# Patient Record
Sex: Female | Born: 1975
Health system: Southern US, Community
[De-identification: ages and names within clinical notes are randomized; demographics above are authoritative.]

## PROBLEM LIST (undated history)

## (undated) DIAGNOSIS — R87610 Atypical squamous cells of undetermined significance on cytologic smear of cervix (ASC-US): Secondary | ICD-10-CM

## (undated) DIAGNOSIS — L719 Rosacea, unspecified: Secondary | ICD-10-CM

## (undated) HISTORY — DX: Rosacea, unspecified: L71.9

---
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
## 1998-12-20 ENCOUNTER — Inpatient Hospital Stay (HOSPITAL_COMMUNITY): Admission: AD | Admit: 1998-12-20 | Discharge: 1998-12-22 | Payer: Self-pay | Admitting: Gynecology

## 1999-02-03 ENCOUNTER — Other Ambulatory Visit: Admission: RE | Admit: 1999-02-03 | Discharge: 1999-02-03 | Payer: Self-pay | Admitting: Gynecology

## 1999-09-15 ENCOUNTER — Other Ambulatory Visit: Admission: RE | Admit: 1999-09-15 | Discharge: 1999-09-15 | Payer: Self-pay | Admitting: Gynecology

## 2000-04-05 ENCOUNTER — Inpatient Hospital Stay (HOSPITAL_COMMUNITY): Admission: AD | Admit: 2000-04-05 | Discharge: 2000-04-06 | Payer: Self-pay | Admitting: Gynecology

## 2001-11-22 ENCOUNTER — Other Ambulatory Visit: Admission: RE | Admit: 2001-11-22 | Discharge: 2001-11-22 | Payer: Self-pay | Admitting: Gynecology

## 2003-07-02 ENCOUNTER — Other Ambulatory Visit: Admission: RE | Admit: 2003-07-02 | Discharge: 2003-07-02 | Payer: Self-pay | Admitting: Gynecology

## 2004-03-07 ENCOUNTER — Inpatient Hospital Stay (HOSPITAL_COMMUNITY): Admission: AD | Admit: 2004-03-07 | Discharge: 2004-03-08 | Payer: Self-pay | Admitting: Gynecology

## 2004-06-30 ENCOUNTER — Inpatient Hospital Stay (HOSPITAL_COMMUNITY): Admission: AD | Admit: 2004-06-30 | Discharge: 2004-07-02 | Payer: Self-pay | Admitting: Obstetrics and Gynecology

## 2004-08-11 ENCOUNTER — Other Ambulatory Visit: Admission: RE | Admit: 2004-08-11 | Discharge: 2004-08-11 | Payer: Self-pay | Admitting: Gynecology

## 2005-03-21 ENCOUNTER — Encounter: Admission: RE | Admit: 2005-03-21 | Discharge: 2005-03-21 | Payer: Self-pay | Admitting: Family Medicine

## 2005-03-21 ENCOUNTER — Encounter: Payer: Self-pay | Admitting: Internal Medicine

## 2005-03-30 ENCOUNTER — Encounter: Payer: Self-pay | Admitting: Internal Medicine

## 2005-03-30 ENCOUNTER — Encounter: Admission: RE | Admit: 2005-03-30 | Discharge: 2005-03-30 | Payer: Self-pay | Admitting: Family Medicine

## 2005-05-01 ENCOUNTER — Encounter: Admission: RE | Admit: 2005-05-01 | Discharge: 2005-05-01 | Payer: Self-pay | Admitting: Otolaryngology

## 2005-09-21 ENCOUNTER — Other Ambulatory Visit: Admission: RE | Admit: 2005-09-21 | Discharge: 2005-09-21 | Payer: Self-pay | Admitting: Gynecology

## 2006-06-14 ENCOUNTER — Encounter: Admission: RE | Admit: 2006-06-14 | Discharge: 2006-06-14 | Payer: Self-pay | Admitting: Otolaryngology

## 2006-11-15 ENCOUNTER — Other Ambulatory Visit: Admission: RE | Admit: 2006-11-15 | Discharge: 2006-11-15 | Payer: Self-pay | Admitting: Gynecology

## 2007-11-17 ENCOUNTER — Other Ambulatory Visit: Admission: RE | Admit: 2007-11-17 | Discharge: 2007-11-17 | Payer: Self-pay | Admitting: Gynecology

## 2008-07-23 LAB — CONVERTED CEMR LAB: Pap Smear: NORMAL

## 2008-09-05 ENCOUNTER — Encounter: Payer: Self-pay | Admitting: Internal Medicine

## 2009-02-12 ENCOUNTER — Ambulatory Visit: Payer: Self-pay | Admitting: Gynecology

## 2009-02-12 ENCOUNTER — Other Ambulatory Visit: Admission: RE | Admit: 2009-02-12 | Discharge: 2009-02-12 | Payer: Self-pay | Admitting: Gynecology

## 2009-02-12 ENCOUNTER — Encounter: Payer: Self-pay | Admitting: Gynecology

## 2009-05-28 ENCOUNTER — Ambulatory Visit: Payer: Self-pay | Admitting: Internal Medicine

## 2009-05-28 LAB — CONVERTED CEMR LAB
ALT: 12 units/L (ref 0–35)
AST: 15 units/L (ref 0–37)
Albumin: 3.9 g/dL (ref 3.5–5.2)
Alkaline Phosphatase: 31 units/L — ABNORMAL LOW (ref 39–117)
BUN: 11 mg/dL (ref 6–23)
Basophils Absolute: 0 10*3/uL (ref 0.0–0.1)
Basophils Relative: 0.7 % (ref 0.0–3.0)
Bilirubin Urine: NEGATIVE
Bilirubin, Direct: 0.1 mg/dL (ref 0.0–0.3)
CO2: 28 meq/L (ref 19–32)
Calcium: 9.2 mg/dL (ref 8.4–10.5)
Chloride: 105 meq/L (ref 96–112)
Cholesterol: 143 mg/dL (ref 0–200)
Creatinine, Ser: 0.7 mg/dL (ref 0.4–1.2)
Eosinophils Absolute: 0 10*3/uL (ref 0.0–0.7)
Eosinophils Relative: 0.8 % (ref 0.0–5.0)
GFR calc non Af Amer: 101.5 mL/min (ref >60)
Glucose, Bld: 78 mg/dL (ref 70–99)
HCT: 40.6 % (ref 36.0–46.0)
HDL: 48.1 mg/dL (ref >39.00)
Hemoglobin, Urine: NEGATIVE
Hemoglobin: 13.3 g/dL (ref 12.0–15.0)
LDL Cholesterol: 85 mg/dL (ref 0–99)
Leukocytes, UA: NEGATIVE
Lymphocytes Relative: 38.2 % (ref 12.0–46.0)
Lymphs Abs: 2 10*3/uL (ref 0.7–4.0)
MCHC: 32.8 g/dL (ref 30.0–36.0)
MCV: 95.6 fL (ref 78.0–100.0)
Monocytes Absolute: 0.5 10*3/uL (ref 0.1–1.0)
Monocytes Relative: 9.2 % (ref 3.0–12.0)
Neutro Abs: 2.7 10*3/uL (ref 1.4–7.7)
Neutrophils Relative %: 51.1 % (ref 43.0–77.0)
Nitrite: NEGATIVE
Platelets: 196 10*3/uL (ref 150.0–400.0)
Potassium: 4.1 meq/L (ref 3.5–5.1)
RBC: 4.25 M/uL (ref 3.87–5.11)
RDW: 11.8 % (ref 11.5–14.6)
Sodium: 139 meq/L (ref 135–145)
Specific Gravity, Urine: 1.025 (ref 1.000–1.030)
TSH: 1.98 microintl units/mL (ref 0.35–5.50)
Total Bilirubin: 0.9 mg/dL (ref 0.3–1.2)
Total CHOL/HDL Ratio: 3
Total Protein, Urine: NEGATIVE mg/dL
Total Protein: 6.9 g/dL (ref 6.0–8.3)
Triglycerides: 49 mg/dL (ref 0.0–149.0)
Urine Glucose: NEGATIVE mg/dL
Urobilinogen, UA: 0.2 (ref 0.0–1.0)
VLDL: 9.8 mg/dL (ref 0.0–40.0)
WBC: 5.2 10*3/uL (ref 4.5–10.5)
pH: 5.5 (ref 5.0–8.0)

## 2009-05-30 ENCOUNTER — Ambulatory Visit: Payer: Self-pay | Admitting: Internal Medicine

## 2009-05-30 DIAGNOSIS — F411 Generalized anxiety disorder: Secondary | ICD-10-CM | POA: Insufficient documentation

## 2009-05-30 DIAGNOSIS — G43009 Migraine without aura, not intractable, without status migrainosus: Secondary | ICD-10-CM | POA: Insufficient documentation

## 2009-05-31 ENCOUNTER — Telehealth: Payer: Self-pay | Admitting: Internal Medicine

## 2009-06-07 ENCOUNTER — Encounter: Payer: Self-pay | Admitting: Internal Medicine

## 2009-06-18 ENCOUNTER — Encounter: Payer: Self-pay | Admitting: Internal Medicine

## 2009-09-04 ENCOUNTER — Ambulatory Visit: Payer: Self-pay | Admitting: Gynecology

## 2009-10-09 ENCOUNTER — Ambulatory Visit: Payer: Self-pay | Admitting: Gynecology

## 2009-12-10 ENCOUNTER — Ambulatory Visit: Payer: Self-pay | Admitting: Gynecology

## 2010-01-20 ENCOUNTER — Ambulatory Visit: Payer: Self-pay | Admitting: Internal Medicine

## 2010-06-18 ENCOUNTER — Ambulatory Visit
Admission: RE | Admit: 2010-06-18 | Discharge: 2010-06-18 | Payer: Self-pay | Source: Home / Self Care | Attending: Internal Medicine | Admitting: Internal Medicine

## 2010-06-18 DIAGNOSIS — H1030 Unspecified acute conjunctivitis, unspecified eye: Secondary | ICD-10-CM | POA: Insufficient documentation

## 2010-06-24 NOTE — Assessment & Plan Note (Signed)
Summary: NEW PATIENT/HEADACHES FOR A WHILE/BCBS #-LB   Vital Signs:  Patient profile:   35 year old female Height:      65 inches Weight:      118 pounds BMI:     19.71 O2 Sat:      99 % on Room air Temp:     98.6 degrees F oral Pulse rate:   56 / minute BP sitting:   110 / 68  (left arm) Cuff size:   regular  Vitals Entered ByZella Ball Ewing (May 30, 2009 1:07 PM)  O2 Flow:  Room air  Preventive Care Screening  Pap Smear:    Date:  07/23/2008    Results:  normal   CC: new pt.,headaches/RE Comments declines tetanus   CC:  new pt. and headaches/RE.  History of Present Illness: here with 2 to 3 yrs with rcurring similar headaches, random seeming, no obvious iinciting event;  located behind eye s  bilat; pressure like and somewhat throbbing, usually mild to mod, can function and does not to go sleep, advil has helped in the past, + photophobia (no phonophobia), no n/v; also mentions recent hx of left mastoidids iwth bloody external otitis 2008 - tx with antibx extended (no surgury, but did have a cyst in the bony structures that she was asked to f/u but did not and apparetnly no problem since then);  ever since then however has had chronic nasal congestion with post nasal gtt , with thick secretions in the AM foul smelling;  no hx of head injury, and recent optho exam less than one yr.  Not tried other OTC meds except advil. NO other rx meds for the pain itself.  Has had tx in the past with several different meds such as bupropio (jjust weaned off 2 mo ago), and o/w has tried paxil (apathy);  zoloft, effexor, (not tried prozac or  cymbalta).  Only gets panic randomly and only weekly.    admits to variable compliacne with daily meds and does not like to take meds, and taking meds actually seems to bring on the panic, as well as ETOH.   Does not think she needs cousneling. .  No suicidal ideation.    Preventive Screening-Counseling & Management  Alcohol-Tobacco     Smoking Status:  quit  Problems Prior to Update: 1)  Migraine, Common  (ICD-346.10) 2)  Anxiety  (ICD-300.00)  Medications Prior to Update: 1)  None  Current Medications (verified): 1)  Sumatriptan Succinate 100 Mg Tabs (Sumatriptan Succinate) .Marland Kitchen.. 1po Once Daily As Needed 2)  Alprazolam 0.25 Mg Tabs (Alprazolam) .... 1/2 - 1 By Mouth Two Times A Day As Needed  Allergies (verified): 1)  ! 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 mother with HTN  Social History: Reviewed history and no changes required. Married 3 chidlren work  - stay at home Former Smoker 0- quit 2005 Alcohol use-yes - social  Smoking Status:  quit  Review of Systems  The patient denies anorexia, fever, weight loss, weight gain, vision loss, decreased hearing, hoarseness, chest pain, syncope, dyspnea on exertion, peripheral edema, prolonged cough, hemoptysis, abdominal pain, melena, hematochezia, severe indigestion/heartburn, hematuria,  incontinence, muscle weakness, suspicious skin lesions, transient blindness, difficulty walking, unusual weight change, abnormal bleeding, enlarged lymph nodes, angioedema, and depression.         all otherwise negative per pt   Physical Exam  General:  alert and well-developed.   Head:  normocephalic and atraumatic.   Eyes:  vision grossly intact, pupils equal, and pupils round.   Ears:  R ear normal and L ear normal.   Nose:  no external deformity and no nasal discharge.   Mouth:  no gingival abnormalities and pharynx pink and moist.   Neck:  supple and no masses.   Lungs:  normal respiratory effort and normal  breath sounds.   Heart:  normal rate and regular rhythm.   Abdomen:  soft, non-tender, and normal bowel sounds.   Msk:  no joint tenderness and no joint swelling.   Extremities:  no edema, no erythema  Neurologic:  cranial nerves II-XII intact, strength normal in all extremities, and sensation intact to light touch.   Skin:  no rashes and no edema.     Impression & Recommendations:  Problem # 1:  Preventive Health Care (ICD-V70.0) Overall doing well, up to date, counseled on routine health concerns for screening and prevention, immunizations up to date or declined, labs reviewed,  Problem # 2:  MIGRAINE, COMMON (ICD-346.10)  Her updated medication list for this problem includes:    Sumatriptan Succinate 100 Mg Tabs (Sumatriptan succinate) .Marland Kitchen... 1po once daily as needed treat as above, f/u any worsening signs or symptoms   Problem # 3:  ANXIETY (ICD-300.00)  Her updated medication list for this problem includes:    Alprazolam 0.25 Mg Tabs (Alprazolam) .Marland Kitchen... 1/2 - 1 by mouth two times a day as needed treat as above, f/u any worsening signs or symptoms   Complete Medication List: 1)  Sumatriptan Succinate 100 Mg Tabs (Sumatriptan succinate) .Marland Kitchen.. 1po once daily as needed 2)  Alprazolam 0.25 Mg Tabs (Alprazolam) .... 1/2 - 1 by mouth two times a day as needed  Patient Instructions: 1)  Please take all new medications as prescribed 2)  Continue all previous medications as before this visit  3)  Please schedule a follow-up appointment in 1 year or sooner if needed Prescriptions: ALPRAZOLAM 0.25 MG TABS (ALPRAZOLAM) 1/2 - 1 by mouth two times a day as needed  #60 x 1   Entered and Authorized by:   Corwin Levins MD   Signed by:   Corwin Levins MD on 05/30/2009   Method used:   Print then Give to Patient   RxID:   [id number] SUMATRIPTAN SUCCINATE 100 MG TABS (SUMATRIPTAN SUCCINATE) 1po once daily as needed  #9 x 11   Entered and Authorized by:   Corwin Levins MD   Signed by:    Corwin Levins MD on 05/30/2009   Method used:   Print then Give to Patient   RxID:   [id number]

## 2010-06-24 NOTE — Progress Notes (Signed)
Summary: Pink Eye--need rx  Phone Note Call from Patient Call back at [phone]   Summary of Call: Patient left message on triage that she has caught pink from her children and would prescription [facility] drops [facility] [address]. Please advise. Initial call taken by: Lucious Groves,  May 31, 2009 10:27 AM  Follow-up for Phone Call        ok for tx - I will do escript Follow-up by: Corwin Levins MD,  May 31, 2009 10:29 AM  Additional Follow-up for Phone Call Additional follow up Details #1::        Left message on voicemail notifying patient. Additional Follow-up by: Lucious Groves,  May 31, 2009 11:00 AM    New/Updated Medications: TOBRAMYCIN SULFATE 10 MG/ML SOLN (TOBRAMYCIN SULFATE) 2 gtts to affected eye qid for 10 days Prescriptions: TOBRAMYCIN SULFATE 10 MG/ML SOLN (TOBRAMYCIN SULFATE) 2 gtts to affected eye qid for 10 days  #1 x 0   Entered and Authorized by:   Corwin Levins MD   Signed by:   Corwin Levins MD on 05/31/2009   Method used:   [facility]        [facility]  [address] [phone][facility] (retail)       [address]       [place], [place]  [address]       Ph: [phone] or [phone]       Fax: [phone]   RxID:   [id number]

## 2010-06-24 NOTE — Assessment & Plan Note (Signed)
Summary: ?puffy eyes/cd   Vital Signs:  Patient profile:   35 year old female Height:      66.5 inches Weight:      123.50 pounds BMI:     19.71 O2 Sat:      97 % on Room air Temp:     98.6 degrees F oral Pulse rate:   57 / minute BP sitting:   100 / 60  (left arm) Cuff size:   regular  Vitals Entered By: Zella Ball Ewing CMA Duncan Dull) (January 20, 2010 3:56 PM)  O2 Flow:  Room air CC: Both eyes swollen for 3 days/RE   CC:  Both eyes swollen for 3 days/RE.  History of Present Illness: here with acute onset bilat eye discomfort, ? low grade temp, with AM matting, with no vision problem, sinus pain or congestion , headache or ST.  Pt denies CP, worsening sob, doe, wheezing, orthopnea, pnd, worsening LE edema, palps, dizziness or syncope  No obvious sick contacts  Problems Prior to Update: 1)  Conjunctivitis, Acute, Bilateral  (ICD-372.00) 2)  Preventive Health Care  (ICD-V70.0) 3)  Migraine, Common  (ICD-346.10) 4)  Anxiety  (ICD-300.00)  Medications Prior to Update: 1)  Sumatriptan Succinate 100 Mg Tabs (Sumatriptan Succinate) .Marland Kitchen.. 1po Once Daily As Needed 2)  Alprazolam 0.25 Mg Tabs (Alprazolam) .... 1/2 - 1 By Mouth Two Times A Day As Needed 3)  Tobramycin Sulfate 10 Mg/ml Soln (Tobramycin Sulfate) .... 2 Gtts To Affected Eye Qid For 10 Days  Current Medications (verified): 1)  Erythromycin 5 Mg/gm Oint (Erythromycin) .... Use Asd Four Times Per Day For 7 Days  Allergies (verified): 1)  ! Pcn 2)  Sulfa 3)  Paxil  Past History:  Past Medical History: Last updated: 05/30/2009 Anxiety  Past Surgical History: Last updated: 05/30/2009 Denies surgical history  Social History: Last updated: 05/30/2009 Married 3 chidlren work  - stay at home Former Smoker 0- quit 2005 Alcohol use-yes - social  Risk Factors: Smoking Status: quit (05/30/2009)  Review of Systems       all otherwise negative per pt -    Physical Exam  General:  alert and well-developed.   Head:   normocephalic and atraumatic.   Eyes:  vision grossly intact, pupils equal, and pupils round, and bilat conjunctival erythema, slight cloudy d/c.   Ears:  bilat tm's mild red, sinus nontender Nose:  nasal dischargemucosal pallor and mucosal edema.   Mouth:  no gingival abnormalities and pharynx pink and moist.   Neck:  supple and no masses.   Lungs:  normal respiratory effort and normal breath sounds.   Heart:  normal rate and regular rhythm.   Extremities:  no edema, no erythema    Impression & Recommendations:  Problem # 1:  CONJUNCTIVITIS, ACUTE, BILATERAL (ICD-372.00)  Her updated medication list for this problem includes:    Erythromycin 5 Mg/gm Oint (Erythromycin) ..... Use asd four times per day for 7 days treat as above, f/u any worsening signs or symptoms   Complete Medication List: 1)  Erythromycin 5 Mg/gm Oint (Erythromycin) .... Use asd four times per day for 7 days  Patient Instructions: 1)  Please take all new medications as prescribed 2)  Continue all previous medications as before this visit 3)  you  can also use the alavert that you have at home 4)  Please schedule a follow-up appointment as needed. Prescriptions: ERYTHROMYCIN 5 MG/GM OINT (ERYTHROMYCIN) use asd four times per day for 7 days  #1 x  0   Entered and Authorized by:   Corwin Levins MD   Signed by:   Corwin Levins MD on 01/20/2010   Method used:   Print then Give to Patient   RxID:   [id number]

## 2010-06-24 NOTE — Miscellaneous (Signed)
  Clinical Lists Changes  Allergies: Added new allergy or adverse reaction of SULFA Added new allergy or adverse reaction of PAXIL Observations: Added new observation of ALLERGY REV: Done (06/18/2009 17:41)       Allergies: 1)  ! Pcn 2)  Sulfa 3)  Paxil

## 2010-06-24 NOTE — Letter (Signed)
Summary: Mary Mcneil at [facility]   Imported By: Lester Clayton 06/25/2009 08:38:14  _____________________________________________________________________  External Attachment:    Type:   Image     Comment:   External Document

## 2010-06-26 NOTE — Assessment & Plan Note (Signed)
Summary: swollen right eye/john/#/cd   Vital Signs:  Patient profile:   35 year old female Height:      66.5 inches (168.91 cm) Weight:      123 pounds (55.91 kg) O2 Sat:      99 % on Room air Temp:     98.6 degrees F (37.00 degrees C) oral Pulse rate:   55 / minute BP sitting:   100 / 62  (left arm) Cuff size:   regular  Vitals Entered By: Orlan Leavens RMA (June 18, 2010 9:53 AM)  O2 Flow:  Room air CC: (R) eye swollen  Is Patient Diabetic? No Pain Assessment Patient in pain? no        Primary Care Provider:  Corwin Levins MD  CC:  (R) eye swollen .  History of Present Illness: here with acute onset right eye discomfort  onset 4 days ago ? low grade temp started with redness only, now with AM matting denies vision problem, eye pain, sinus pain or congestion, headache or ST.   Pt denies CP, worsening sob, doe, wheezing, orthopnea, pnd, worsening LE edema, palps, dizziness or syncope  No obvious sick contacts (3 kids at home w/o same symptoms)  Current Medications (verified): 1)  None  Allergies (verified): 1)  ! Pcn 2)  Sulfa 3)  Paxil  Past History:  Past Medical History: Anxiety   Review of Systems  The patient denies weight loss, vision loss, decreased hearing, hoarseness, and headaches.    Physical Exam  General:  alert and well-developed.  nontoxic Eyes:  r eye conjunctivitis, clear discharge - vison intact bilaterally - normal Ears:  normal pinnae bilaterally, without erythema, swelling, or tenderness to palpation. TMs clear, without effusion, or cerumen impaction. Hearing grossly normal bilaterally  Mouth:  teeth and gums in good repair; mucous membranes moist, without lesions or ulcers. oropharynx clear without exudate, no erythema.  Lungs:  normal respiratory effort, no intercostal retractions or use of accessory muscles; normal breath sounds bilaterally - no crackles and no wheezes.    Heart:  normal rate, regular rhythm, no murmur, and no rub.  BLE without edema.    Impression & Recommendations:  Problem # 1:  CONJUNCTIVITIS, ACUTE, RIGHT (ICD-372.00)  recurrent symptoms - 3rd in 5mo period no obvious infx trigger but LGF and fatigue - ?viral no eye pain or vision changes - recent eye eval by optho "normal" rec eye makeup discard and careful hygiene to prevent recurrent symptoms  erx optho abx ointment done The following medications were removed from the medication list:    Erythromycin 5 Mg/gm Oint (Erythromycin) ..... Use asd four times per day for 7 days Her updated medication list for this problem includes:    Erythromycin 5 Mg/gm Oint (Erythromycin) .Marland Kitchen... Apply four times a day to affected eye x 7 days  Discussed treatment, and urged patient to wash hands carefully after touching face.   Orders: Prescription Created Electronically [phone])  Complete Medication List: 1)  Erythromycin 5 Mg/gm Oint (Erythromycin) .... Apply four times a day to affected eye x 7 days  Patient Instructions: 1)  Please take all new medications as prescribed - your prescription has been electronically submitted to your pharmacy. Please take as directed. Contact our office if you believe you're having problems with the medication(s).  2)  please discard any eye makeup and  products you have used in past 2 weeks 3)  Please schedule a follow-up appointment as needed. Prescriptions: ERYTHROMYCIN 5 MG/GM OINT (ERYTHROMYCIN)  apply four times a day to affected eye x 7 days  #1 x 0   Entered and Authorized by:   Newt Lukes MD   Signed by:   Newt Lukes MD on 06/18/2010   Method used:   [facility]        [facility]  [address] [phone][facility] (retail)       [address]       [place], [place]  [address]       Ph: [phone] or [phone]       Fax: [phone]   RxID:   [id number]    Orders Added: 1)  Est. Patient Level IV [56387] 2)  Prescription Created Electronically [phone]

## 2010-07-18 ENCOUNTER — Ambulatory Visit (INDEPENDENT_AMBULATORY_CARE_PROVIDER_SITE_OTHER): Payer: BC Managed Care – PPO | Admitting: Internal Medicine

## 2010-07-18 ENCOUNTER — Encounter: Payer: Self-pay | Admitting: Internal Medicine

## 2010-07-18 DIAGNOSIS — F411 Generalized anxiety disorder: Secondary | ICD-10-CM

## 2010-07-18 DIAGNOSIS — J019 Acute sinusitis, unspecified: Secondary | ICD-10-CM

## 2010-07-18 DIAGNOSIS — R062 Wheezing: Secondary | ICD-10-CM

## 2010-07-18 DIAGNOSIS — J209 Acute bronchitis, unspecified: Secondary | ICD-10-CM

## 2010-07-21 ENCOUNTER — Other Ambulatory Visit: Payer: Self-pay | Admitting: Internal Medicine

## 2010-07-21 ENCOUNTER — Ambulatory Visit (INDEPENDENT_AMBULATORY_CARE_PROVIDER_SITE_OTHER): Payer: BC Managed Care – PPO | Admitting: Internal Medicine

## 2010-07-21 ENCOUNTER — Ambulatory Visit (INDEPENDENT_AMBULATORY_CARE_PROVIDER_SITE_OTHER)
Admission: RE | Admit: 2010-07-21 | Discharge: 2010-07-21 | Disposition: A | Discharge status: Home / Self Care | Payer: BC Managed Care – PPO | Source: Ambulatory Visit | Attending: Internal Medicine | Admitting: Internal Medicine

## 2010-07-21 ENCOUNTER — Encounter: Payer: Self-pay | Admitting: Internal Medicine

## 2010-07-21 DIAGNOSIS — R062 Wheezing: Secondary | ICD-10-CM

## 2010-07-21 DIAGNOSIS — J329 Chronic sinusitis, unspecified: Secondary | ICD-10-CM

## 2010-07-21 DIAGNOSIS — F411 Generalized anxiety disorder: Secondary | ICD-10-CM

## 2010-07-21 DIAGNOSIS — J019 Acute sinusitis, unspecified: Secondary | ICD-10-CM

## 2010-07-22 NOTE — Assessment & Plan Note (Signed)
Summary: ?sinus infection   Vital Signs:  Patient profile:   35 year old female Height:      65 inches Weight:      121.25 pounds BMI:     20.25 O2 Sat:      98 % on Room air Temp:     98.6 degrees F oral Pulse rate:   65 / minute BP sitting:   118 / 80  (left arm) Cuff size:   regular  Vitals Entered By: Zella Ball Ewing CMA Duncan Dull) (July 18, 2010 11:33 AM)  O2 Flow:  Room air CC: Sinus infection, right eye pressure/RE R  Primary Care Provider:  Corwin Levins MD  CC:  Sinus infection and right eye pressure/RE.  History of Present Illness: here with 3 days mild to mod fever, HA, general weakness and malaise, facial pain , pressure, and greenish d/c, now with  increased prod cough with greenish sputum today as well;  with mild wheeze and sob;  Pt denies CP, orthopnea, pnd, worsening LE edema, palps, dizziness or syncope  Pt denies new neuro symptoms such as headache, facial or extremity weakness  Pt denies polydipsia, polyuria  Overall good compliance with meds, trying to follow low chol  diet, wt stable.  No recent wt loss, night sweats, loss of appetite or other constitutional symptoms Denies worsening depressive symptoms, suicidal ideation, or panic, though has some mild anxiety no worse recently.    Preventive Screening-Counseling & Management      Drug Use:  no.    Problems Prior to Update: 1)  Sinusitis- Acute-nos  (ICD-461.9) 2)  Wheezing  (ICD-786.07) 3)  Bronchitis-acute  (ICD-466.0) 4)  Conjunctivitis, Acute, Right  (ICD-372.00) 5)  Preventive Health Care  (ICD-V70.0) 6)  Migraine, Common  (ICD-346.10) 7)  Anxiety  (ICD-300.00)  Medications Prior to Update: 1)  None  Current Medications (verified): 1)  Levofloxacin 250 Mg Tabs (Levofloxacin) .Marland Kitchen.. 1po Once Daily 2)  Prednisone 10 Mg Tabs (Prednisone) .... 3po Qd For 3days, Then 2po Qd For 3days, Then 1po Qd For 3days, Then Stop  Allergies (verified): 1)  ! Pcn 2)  Sulfa 3)  Paxil  Past History:  Past  Medical History: Last updated: 06/18/2010 Anxiety   Past Surgical History: Last updated: 05/30/2009 Denies surgical history  Social History: Last updated: 07/18/2010 Married 3 chidlren work  - stay at home Former Smoker 0- quit 2005 Alcohol use-yes - social Drug use-no  Risk Factors: Smoking Status: quit (05/30/2009)  Social History: Married 3 chidlren work  - stay at home Former Smoker 0- quit 2005 Alcohol use-yes - social Drug use-no Drug Use:  no  Review of Systems       all otherwise negative per pt -    Physical Exam  General:  alert and underweight appearing.   Head:  normocephalic and atraumatic.   Eyes:  vision grossly intact, pupils equal, and pupils round.   Ears:  bilat tm's mild red, sinus tender bilat Nose:  nasal dischargemucosal pallor and mucosal edema.   Mouth:  pharyngeal erythema and fair dentition.   Neck:  supple and cervical lymphadenopathy.   Lungs:  normal respiratory effort, R decreased breath sounds, R wheezes, L decreased breath sounds, and L wheezes - overall very mild Heart:  normal rate and regular rhythm.   Extremities:  no edema, no erythema  Skin:  color normal and no rashes.   Psych:  not depressed appearing and slightly anxious.     Impression & Recommendations:  Problem #  1:  SINUSITIS- ACUTE-NOS (ICD-461.9)  Her updated medication list for this problem includes:    Levofloxacin 250 Mg Tabs (Levofloxacin) .Marland Kitchen... 1po once daily treat as above, f/u any worsening signs or symptoms   Instructed on treatment. Call if symptoms persist or worsen.   Problem # 2:  WHEEZING (ICD-786.07) very mild, for predpack if she feel worse, and she has access to her daughter;s albuterol inhaler - delcines her own at this time  Problem # 3:  BRONCHITIS-ACUTE (ICD-466.0)  Her updated medication list for this problem includes:    Levofloxacin 250 Mg Tabs (Levofloxacin) .Marland Kitchen... 1po once daily  Take antibiotics and other medications as  directed. Encouraged to push clear liquids, get enough rest, and take acetaminophen as needed. To be seen in 5-7 days if no improvement, sooner if worse.  Problem # 4:  ANXIETY (ICD-300.00)  Discussed medication use and relaxation techniques.  stable overall by hx and exam, ok to continue meds/tx as is - no meds needed at this time such as SSRI trial  Complete Medication List: 1)  Levofloxacin 250 Mg Tabs (Levofloxacin) .Marland Kitchen.. 1po once daily 2)  Prednisone 10 Mg Tabs (Prednisone) .... 3po qd for 3days, then 2po qd for 3days, then 1po qd for 3days, then stop  Patient Instructions: 1)  Please take all new medications as prescribed 2)  Continue all previous medications as before this visit  3)  You can also use Mucinex OTC or it's generic for congestion , and Delsym OTC for cough 4)  Please schedule a follow-up appointment as needed. Prescriptions: PREDNISONE 10 MG TABS (PREDNISONE) 3po qd for 3days, then 2po qd for 3days, then 1po qd for 3days, then stop  #18 x 0   Entered and Authorized by:   Corwin Levins MD   Signed by:   Corwin Levins MD on 07/18/2010   Method used:   Print then Give to Patient   RxID:   [id number] LEVOFLOXACIN 250 MG TABS (LEVOFLOXACIN) 1po once daily  #10 x 0   Entered and Authorized by:   Corwin Levins MD   Signed by:   Corwin Levins MD on 07/18/2010   Method used:   Print then Give to Patient   RxID:   [id number]    Orders Added: 1)  Est. Patient Level IV [84166]

## 2010-07-31 NOTE — Assessment & Plan Note (Signed)
Summary: ACUTE--SWOLLEN EYES---STC   Vital Signs:  Patient profile:   35 year old female Height:      66 inches Weight:      123.38 pounds BMI:     19.99 O2 Sat:      98 % on Room air Temp:     98.8 degrees F oral Pulse rate:   65 / minute BP sitting:   120 / 60  (left arm) Cuff size:   regular  Vitals Entered By: Zella Ball Ewing CMA Duncan Dull) (July 21, 2010 10:52 AM)  O2 Flow:  Room air CC: Both eyes infected/RE   Primary Care Provider:  Corwin Levins MD  CC:  Both eyes infected/RE.  History of Present Illness: here to f/u; overall doing some better but still with significant sinus pain, presure and unusual sweling about the eyes, though the swelling over the right maxillary sinus area is well improved;  No fever, wt loss, night sweats, loss of appetite or other constitutional symptoms  Pt denies CP, worsening sob, doe, wheezing, orthopnea, pnd, worsening LE edema, palps, dizziness or syncope  Pt denies new neuro symptoms such as headache, facial or extremity weakness  Pt denies polydipsia, polyuria. Has had ongoing years of chornic sinusitis symtpoms with foul smelling post nasal gtt, last saw ENT year ago, not tried flonase or other for chronic sinus symtpoms Denies worsening depressive symptoms, suicidal ideation, or panic, though has ongoin mild anxiety  Problems Prior to Update: 1)  Sinusitis, Chronic  (ICD-473.9) 2)  Sinusitis- Acute-nos  (ICD-461.9) 3)  Wheezing  (ICD-786.07) 4)  Bronchitis-acute  (ICD-466.0) 5)  Conjunctivitis, Acute, Right  (ICD-372.00) 6)  Preventive Health Care  (ICD-V70.0) 7)  Migraine, Common  (ICD-346.10) 8)  Anxiety  (ICD-300.00)  Medications Prior to Update: 1)  Levofloxacin 250 Mg Tabs (Levofloxacin) .Marland Kitchen.. 1po Once Daily 2)  Prednisone 10 Mg Tabs (Prednisone) .... 3po Qd For 3days, Then 2po Qd For 3days, Then 1po Qd For 3days, Then Stop  Current Medications (verified): 1)  Levofloxacin 250 Mg Tabs (Levofloxacin) .Marland Kitchen.. 1po Once Daily 2)   Fluticasone Propionate 50 Mcg/act Susp (Fluticasone Propionate) .... 2 Spray/side Once Daily  Allergies (verified): 1)  ! 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   Eyes:  vision grossly intact, pupils equal, and pupils round.   Ears:  bilat tm's mild red, sinus tender bilat but less than last visit Nose:  nasal dischargemucosal pallor and mucosal edema.   Mouth:  pharyngeal erythema and fair dentition.   Neck:  supple and cervical lymphadenopathy.   Lungs:  normal respiratory effort and normal breath sounds.   Heart:  normal rate and regular rhythm.   Extremities:  no edema, no erythema  Psych:  not depressed appearing and slightly anxious.     Impression & Recommendations:  Problem # 1:  SINUSITIS- ACUTE-NOS (ICD-461.9)  Her updated medication list for this problem includes:    Levofloxacin 250 Mg Tabs (Levofloxacin) .Marland Kitchen... 1po once daily  Fluticasone Propionate 50 Mcg/act Susp (Fluticasone propionate) .Marland Kitchen... 2 spray/side once daily  Orders: ENT Referral (ENT) treat as above, f/u any worsening signs or symptoms   Instructed on treatment. Call if symptoms persist or worsen.   Problem # 2:  SINUSITIS, CHRONIC (ICD-473.9)  Her updated medication list for this problem includes:    Levofloxacin 250 Mg Tabs (Levofloxacin) .Marland Kitchen... 1po once daily    Fluticasone Propionate 50 Mcg/act Susp (Fluticasone propionate) .Marland Kitchen... 2 spray/side once daily  Orders: ENT Referral (ENT) Radiology  Referral (Radiology) treat as above, f/u any worsening signs or symptoms , also for ENT to evaluate -   Take antibiotics for full duration. Discussed treatment options including indications for coronal CT scan of sinuses and ENT referral.   Problem # 3:  WHEEZING (ICD-786.07) resolved - stable overall by hx and exam, ok to continue meds/tx as is   Problem # 4:  ANXIETY (ICD-300.00) stable overall by hx and exam, ok to continue meds/tx as is   Complete Medication List: 1)  Levofloxacin 250 Mg Tabs (Levofloxacin) .Marland Kitchen.. 1po once daily 2)  Fluticasone Propionate 50 Mcg/act Susp (Fluticasone propionate) .... 2 spray/side once daily  Patient Instructions: 1)  Please take all new medications as prescribed - the flonase nasal spray 2)  Continue all previous medications as before this visit , including the antibiotic 3)  You will be contacted about the referral(s) to: CT scan for sinus, and Dr Rosen/ENT 4)  Please schedule a follow-up appointment as needed. Prescriptions: FLUTICASONE PROPIONATE 50 MCG/ACT SUSP (FLUTICASONE PROPIONATE) 2 spray/side once daily  #1 x 5   Entered and Authorized by:   Corwin Levins MD   Signed by:   Corwin Levins MD on 07/21/2010   Method used:   Print then Give to Patient   RxID:   [id number]    Orders Added: 1)  ENT Referral [ENT] 2)  Radiology Referral [Radiology] 3)  Est. Patient Level IV [78469]

## 2010-10-10 NOTE — Discharge Summary (Signed)
[facility]  Patient:    Mary Mcneil, Mary Mcneil                      MRN: [id number] Adm. Date:  [id number] Disc. Date: [id number] Attending:  Wetzel Bjornstad Dictator:   Antony Contras, [facility]                           Discharge Summary  DISCHARGE DIAGNOSES:          Intrauterine pregnancy at 38 weeks with spontaneous onset of labor.  PROCEDURE:                    Normal spontaneous vaginal delivery over intact perineum with repair of second degree laceration.  HISTORY OF PRESENT ILLNESS:   Patient is a 35 year old gravida 2, para 1-0-0-1 with LMP July 09, 1999, [facility] April 13, 2000.  Prenatal course was uncomplicated.  LABORATORIES:                 Blood type O+.  Antibody screen negative.  RPR, HBSAG, HIV nonreactive.  Rubella immune.  MSAFP was normal.  GBS negative.  HOSPITAL COURSE:              Patient was admitted on April 05, 2000 with spontaneous onset of labor.  Cervix on admission was 7 cm, 100%.  Labor did progress to complete dilatation.  She delivered an 8 pound female infant with Apgars 7 and 9 over intact perineum with a second degree laceration which was repaired.  Patient did request discharge on her first postpartum day and was able to be discharged in satisfactory condition.  Hematocrit 33.7, hemoglobin 11.8, WBC 11.6, platelets 162.  DISPOSITION:                  Follow up in six weeks.  Continue Motrin, Tylox for pain.  Prenatal vitamins and iron. DD:  04/30/00 TD:  04/30/00 Job: [id number]

## 2010-10-10 NOTE — H&P (Signed)
NAMEMICKI, CASSEL             ACCOUNT NO.:  [id number]   MEDICAL RECORD NO.:  [id number]           PATIENT TYPE:   LOCATION:                                 FACILITY:   PHYSICIAN:  Timothy P. Fontaine, M.D.DATE OF BIRTH:  Oct 16, 1975   DATE OF ADMISSION:  06/30/2004  DATE OF DISCHARGE:                                HISTORY & PHYSICAL   CHIEF COMPLAINT:  Favorable cervix at term.   HISTORY OF PRESENT ILLNESS:  A 35 year old, G3, P2 female, 11 to 55 weeks'  gestation with an exam 2- to -3-cm dilated, 60% effaced, -2 station,  admitted for induction.  The patient's obstetrical history has been  uncomplicated.  She is beta strep negative.  For the remainder of her  history, see her [place].   PHYSICAL EXAMINATION:  HEENT:  Normal.  LUNGS:  Clear.  CARDIAC:  Regular rate.  No rubs, murmurs, or gallops.  ABDOMEN:  Gravid, vertex fetus consistent with term.  PELVIC:  At 2- to -3-cm, 60% effaced, -2 station, vertex presentation.   ASSESSMENT:  A 35 year old, G3, P2 female advanced cervical change at term  for induction.  The patient is beta strep negative.   We will plan on Pitocin induction which was discussed and accepted by the  patient.      TPF/MEDQ  D:  06/27/2004  T:  06/27/2004  Job:  [id number]

## 2010-11-06 ENCOUNTER — Ambulatory Visit (INDEPENDENT_AMBULATORY_CARE_PROVIDER_SITE_OTHER): Payer: BC Managed Care – PPO | Admitting: Internal Medicine

## 2010-11-06 ENCOUNTER — Encounter: Payer: Self-pay | Admitting: Internal Medicine

## 2010-11-06 VITALS — BP 92/60 | HR 62 | Temp 98.0°F | Ht 66.0 in | Wt 125.1 lb

## 2010-11-06 DIAGNOSIS — G5600 Carpal tunnel syndrome, unspecified upper limb: Secondary | ICD-10-CM

## 2010-11-06 DIAGNOSIS — J069 Acute upper respiratory infection, unspecified: Secondary | ICD-10-CM

## 2010-11-06 DIAGNOSIS — L03119 Cellulitis of unspecified part of limb: Secondary | ICD-10-CM

## 2010-11-06 DIAGNOSIS — M25569 Pain in unspecified knee: Secondary | ICD-10-CM

## 2010-11-06 DIAGNOSIS — L02419 Cutaneous abscess of limb, unspecified: Secondary | ICD-10-CM

## 2010-11-06 DIAGNOSIS — Z0000 Encounter for general adult medical examination without abnormal findings: Secondary | ICD-10-CM

## 2010-11-06 DIAGNOSIS — L03116 Cellulitis of left lower limb: Secondary | ICD-10-CM | POA: Insufficient documentation

## 2010-11-06 DIAGNOSIS — G5603 Carpal tunnel syndrome, bilateral upper limbs: Secondary | ICD-10-CM

## 2010-11-06 DIAGNOSIS — M25561 Pain in right knee: Secondary | ICD-10-CM | POA: Insufficient documentation

## 2010-11-06 NOTE — Assessment & Plan Note (Addendum)
Mild symptoms assoc with primarily tender neck LA with some discomfort,  for antibx course,  to f/u any worsening symptoms or concerns

## 2010-11-06 NOTE — Assessment & Plan Note (Signed)
Resolved,  to f/u any worsening symptoms or concerns  

## 2010-11-06 NOTE — Assessment & Plan Note (Signed)
Exam benign, suspected patellofemoral syndrome, consider ortho eval, declines for now

## 2010-11-06 NOTE — Assessment & Plan Note (Signed)
Very mild, for bilat wrist splints, declines nsaid, consider hand ortho,  to f/u any worsening symptoms or concerns

## 2010-11-06 NOTE — Patient Instructions (Signed)
Ok to continue the advil as you have been doing Please also take prilosec 20 mg per day if you take the advil to avoid stomach irritation or even ulcers Please wear the wrist splints at night until the discomfort is improved You can also take  Mucinex (or it's generic off brand) for congestion if needed If the hand and wrists are not better in 1 -2 wks, please call for referral to hand surgeon If the knees are not improved in 1-2 wks, please call for refferal to sports orthopedic (Dr Darrick Penna)

## 2010-11-06 NOTE — Progress Notes (Signed)
Subjective:    Patient ID: Mary Mcneil, female    DOB: 01/08/1976, 35 y.o.   MRN: [id number]  HPI  Here to f/u after tx for left leg cellulitis behind the left knee - tx at urgent care with course of doxy, seems to resolve, but ? 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  No prior hx of CTS or other hand or wrist problems,  No trauma, feels overall somewhat swollen, but not particulalry more than usual, and usually does not wear jewelry due to the ongoing swelling. No hx of gout. Incidnetly, also with onset  bilat post neck pain, without radicular pain but pain located worse at the upper neck nad with ? Lymph node.   Pt denies fever, wt loss, night sweats, loss of appetite, or other constitutional symptoms'; Has occas benign HA's (common for her) but no ST, cough and Pt denies chest pain, increased sob or doe, wheezing, orthopnea, PND, increased LE swelling, palpitations, dizziness or syncope.  Pt denies new neurological symptoms such as new headache, or facial or extremity weakness or numbness.   Pt denies polydipsia, polyuria., Also with recurring knee pain bilat , anterior symetrical, not assoc with swelling, mild , but persistent daily, no change in gait usually but if walks down steps can tend to favor the knee than hurts and maybe get off balance , and tries to fall, but no falls yet.  Took 8 advil, but not helping the hands, neck, or knees, although heat helped the neck last night.  Past Medical History  Diagnosis Date  . ANXIETY 05/30/2009  . CONJUNCTIVITIS, ACUTE, RIGHT 06/18/2010  . MIGRAINE, COMMON 05/30/2009  . SINUSITIS, CHRONIC 07/21/2010  . SINUSITIS- ACUTE-NOS 07/18/2010  . Wheezing 07/18/2010   No past surgical history on file.  reports that she has quit smoking. She does not have any smokeless tobacco history on file. She reports that she drinks alcohol. She reports that she  does not use illicit drugs. family history includes Hypertension in her mother and Rheum arthritis in her maternal grandmother. Allergies  Allergen Reactions  . Paroxetine     REACTION: decreased libido  . Penicillins   . Sulfonamide Derivatives     REACTION: decreased appetite   No current outpatient prescriptions on file prior to visit.     Review of Systems Review of Systems  Constitutional: Negative for diaphoresis and unexpected weight change.  HENT: Negative for drooling and tinnitus.   Eyes: Negative for photophobia and visual disturbance.  Respiratory: Negative for choking and stridor.   Gastrointestinal: Negative for vomiting and blood in stool.  Genitourinary: Negative for hematuria and decreased urine volume.  Musculoskeletal: Negative for gait problem.        Objective:   Physical Exam BP 92/60  Pulse 62  Temp(Src) 98 F (36.7 C) (Oral)  Ht 5\' 6"  (1.676 m)  Wt 125 lb 2 oz (56.756 kg)  BMI 20.20 kg/m2  SpO2 99% Physical Exam  VS noted, mild fatigued appearing Constitutional: Pt appears well-developed and well-nourished.  HENT: Head: Normocephalic.  Right Ear: External ear normal.  Left Ear: External ear normal.  Bilat tm's mild erythema.  Sinus nontender.  Pharynx mild erythema Eyes: Conjunctivae and EOM are normal. Pupils are equal, round, and reactive to light.  Neck: Normal range of motion. Neck supple. mult tender submandib LA and one mod sized mod tender prox  neck LN Cardiovascular: Normal rate and regular rhythm.   Pulmonary/Chest: Effort normal and breath sounds normal.  Abd:  Soft, NT, non-distended, + BS Neurological: Pt is alert. No cranial nerve deficit. motor/sens/dtr intact to UE and LE's Skin: Skin is warm. No erythema.  Psychiatric: Pt behavior is normal. Thought content normal.  Bilat knees with FROM , NT, no effusion, no leg cellulitis noted       Assessment & Plan:

## 2011-02-05 DIAGNOSIS — IMO0001 Reserved for inherently not codable concepts without codable children: Secondary | ICD-10-CM | POA: Insufficient documentation

## 2011-02-10 ENCOUNTER — Encounter: Payer: BC Managed Care – PPO | Admitting: Gynecology

## 2011-04-09 ENCOUNTER — Encounter: Payer: BC Managed Care – PPO | Admitting: Gynecology

## 2011-04-22 ENCOUNTER — Encounter: Payer: Self-pay | Admitting: Gynecology

## 2011-04-22 ENCOUNTER — Other Ambulatory Visit (HOSPITAL_COMMUNITY)
Admission: RE | Admit: 2011-04-22 | Discharge: 2011-04-22 | Disposition: A | Discharge status: Home / Self Care | Payer: BC Managed Care – PPO | Source: Ambulatory Visit | Attending: Gynecology | Admitting: Gynecology

## 2011-04-22 ENCOUNTER — Ambulatory Visit (INDEPENDENT_AMBULATORY_CARE_PROVIDER_SITE_OTHER): Payer: BC Managed Care – PPO | Admitting: Gynecology

## 2011-04-22 VITALS — BP 106/68 | Ht 65.0 in | Wt 125.0 lb

## 2011-04-22 DIAGNOSIS — Z131 Encounter for screening for diabetes mellitus: Secondary | ICD-10-CM

## 2011-04-22 DIAGNOSIS — R82998 Other abnormal findings in urine: Secondary | ICD-10-CM

## 2011-04-22 DIAGNOSIS — Z01419 Encounter for gynecological examination (general) (routine) without abnormal findings: Secondary | ICD-10-CM

## 2011-04-22 DIAGNOSIS — Z13220 Encounter for screening for lipoid disorders: Secondary | ICD-10-CM

## 2011-04-22 DIAGNOSIS — N644 Mastodynia: Secondary | ICD-10-CM

## 2011-04-22 NOTE — Progress Notes (Signed)
Mary Mcneil Oct 13, 1975 [id number]        35 y.o.  for annual exam.  Overall doing well. She does note intermittently that she has bilateral breast tenderness it comes and goes. No leakage from the nipples or any other symptoms no palpable abnormalities on self breast exam. She uses IUD for contraception. Her husband does complain of feeling the string with every coital episode.  Past medical history,surgical history, medications, allergies, family history and social history were all reviewed and documented in the EPIC chart. ROS:  Was performed and pertinent positives and negatives are included in the history.  Exam: chaperone present Filed Vitals:   04/22/11 0957  BP: 106/68   General appearance  Normal Skin grossly normal Head/Neck normal with no cervical or supraclavicular adenopathy thyroid normal Lungs  clear Cardiac RR, without RMG Abdominal  soft, nontender, without masses, organomegaly or hernia Breasts  examined lying and sitting without masses, retractions, discharge or axillary adenopathy. Pelvic  Ext/BUS/vagina  normal   Cervix  normal  Pap done. IUD string visualized and trimmed.  Uterus  anteverted, normal size, shape and contour, midline and mobile nontender   Adnexa  Without masses or tenderness    Anus and perineum  normal   Rectovaginal  normal sphincter tone without palpated masses or tenderness.    Assessment/Plan:  35 y.o. female for annual exam.    1. Bilateral mastodynia comes and goes. I think this is physiologic. I will check a prolactin level for completeness. SBE reviewed with her on a monthly basis. Screening mammogram recommendations between 35 and 40 were reviewed. She has no strong family history and she prefers to wait closer to 40. As long as her breast tenderness comes and goes and is bilateral she will monitor, if any palpable abnormalities or persistent discomfort she'll represent. 2. IUD management. IUD placed April 2011. Doing well with this  other than her husband felt a string.  I trimmed it. If he continues to feel that she'll represent.  3. Pap smear. Patient has no history of abnormal Pap smears but it has been 2 years since her last Pap smear or visit. I did a Pap smear today given her less than annual follow up history. Assuming normal then we will plan on less frequent screening every 3 years in accordance with current guidelines. 4. Health maintenance. Check baseline labs of CBC glucose lipid profile urinalysis. Assuming she continues well from a gynecologic standpoint she will see me in a year sooner as needed.    Dara Lords MD, 10:37 AM 04/22/2011

## 2011-04-23 MED ORDER — CIPROFLOXACIN HCL 250 MG PO TABS: 250.0000 mg | ORAL_TABLET | Freq: Two times a day (BID) | ORAL | Status: AC

## 2011-04-23 NOTE — Progress Notes (Signed)
Addended by: Dara Lords on: 04/23/2011 12:27 PM   Modules accepted: Orders

## 2011-07-11 ENCOUNTER — Ambulatory Visit (INDEPENDENT_AMBULATORY_CARE_PROVIDER_SITE_OTHER): Payer: BC Managed Care – PPO | Admitting: Family Medicine

## 2011-07-11 DIAGNOSIS — J329 Chronic sinusitis, unspecified: Secondary | ICD-10-CM

## 2011-07-12 NOTE — Assessment & Plan Note (Signed)
No show at Saturday clinic.

## 2011-07-12 NOTE — Progress Notes (Signed)
No show

## 2012-03-11 ENCOUNTER — Ambulatory Visit (INDEPENDENT_AMBULATORY_CARE_PROVIDER_SITE_OTHER): Payer: BC Managed Care – PPO | Admitting: Internal Medicine

## 2012-03-11 ENCOUNTER — Encounter: Payer: Self-pay | Admitting: Internal Medicine

## 2012-03-11 VITALS — BP 124/82 | HR 63 | Temp 97.7°F | Wt 123.0 lb

## 2012-03-11 DIAGNOSIS — R062 Wheezing: Secondary | ICD-10-CM

## 2012-03-11 DIAGNOSIS — J209 Acute bronchitis, unspecified: Secondary | ICD-10-CM

## 2012-03-11 MED ORDER — HYDROCODONE-HOMATROPINE 5-1.5 MG/5ML PO SYRP: 5.0000 mL | ORAL_SOLUTION | Freq: Four times a day (QID) | ORAL | Status: DC | PRN

## 2012-03-11 MED ORDER — PREDNISONE 10 MG PO TABS: 20.0000 mg | ORAL_TABLET | Freq: Every day | ORAL | Status: DC

## 2012-03-11 MED ORDER — LEVOFLOXACIN 250 MG PO TABS: 250.0000 mg | ORAL_TABLET | Freq: Every day | ORAL | Status: DC

## 2012-03-11 NOTE — Patient Instructions (Addendum)
Take all new medications as prescribed Continue all other medications as before Please remember to sign up for My Chart at your earliest convenience, as this will be important to you in the future with finding out test results.  

## 2012-03-12 ENCOUNTER — Encounter: Payer: Self-pay | Admitting: Internal Medicine

## 2012-03-12 DIAGNOSIS — R062 Wheezing: Secondary | ICD-10-CM | POA: Insufficient documentation

## 2012-03-12 NOTE — Assessment & Plan Note (Signed)
Mild to mod, for antibx course,  to f/u any worsening symptoms or concerns 

## 2012-03-12 NOTE — Progress Notes (Signed)
  Subjective:    Patient ID: Mary Mcneil, female    DOB: 1975-06-12, 36 y.o.   MRN: [id number]  HPI  Here with acute onset mild to mod 2-3 days ST, HA, general weakness and malaise, with prod cough greenish sputum, but Pt denies chest pain, increased sob or doe, wheezing, orthopnea, PND, increased LE swelling, palpitations, dizziness or syncope, except for midl tightness and wheezing since 1-2 days.   Pt denies polydipsia, polyuria.  Pt denies new neurological symptoms such as new headache, or facial or extremity weakness or numbness Past Medical History  Diagnosis Date  . ANXIETY 05/30/2009  . CONJUNCTIVITIS, ACUTE, RIGHT 06/18/2010  . MIGRAINE, COMMON 05/30/2009  . SINUSITIS, CHRONIC 07/21/2010  . SINUSITIS- ACUTE-NOS 07/18/2010  . Wheezing 07/18/2010  . IUD 09/03/2009    MIRENA 07/2004 REPLACED 09/03/2009.   Past Surgical History  Procedure Date  . Intrauterine device insertion 08/2009    MIRENA    reports that she has quit smoking. She does not have any smokeless tobacco history on file. She reports that she drinks alcohol. She reports that she does not use illicit drugs. family history includes Hypertension in her mother and Rheum arthritis in her maternal grandmother. Allergies  Allergen Reactions  . Paroxetine     REACTION: decreased libido  . Penicillins   . Sulfonamide Derivatives     REACTION: decreased appetite   Current Outpatient Prescriptions on File Prior to Visit  Medication Sig Dispense Refill  . fluticasone (FLONASE) 50 MCG/ACT nasal spray Place 2 sprays into the nose daily.        Marland Kitchen levonorgestrel (MIRENA) 20 MCG/24HR IUD 1 each by Intrauterine route once. 07/2004. REPLACED 08/2009/        Review of Systems  Constitutional: Negative for diaphoresis and unexpected weight change.  HENT: Negative for tinnitus.   Eyes: Negative for photophobia and visual disturbance.  Respiratory: Negative for choking and stridor.   Gastrointestinal: Negative for vomiting and blood in  stool.  Genitourinary: Negative for hematuria and decreased urine volume.  Musculoskeletal: Negative for gait problem.  Skin: Negative for color change and wound.  Neurological: Negative for tremors and numbness.  Psychiatric/Behavioral: Negative for decreased concentration. The patient is not hyperactive.       Objective:   Physical Exam BP 124/82  Pulse 63  Temp 97.7 F (36.5 C) (Oral)  Wt 123 lb (55.792 kg)  SpO2 97% Physical Exam  VS noted, mild ill Constitutional: Pt appears well-developed and well-nourished.  HENT: Head: Normocephalic.  Right Ear: External ear normal.  Left Ear: External ear normal.  Bilat tm's mild erythema.  Sinus nontender.  Pharynx mild erythema Eyes: Conjunctivae and EOM are normal. Pupils are equal, round, and reactive to light.  Neck: Normal range of motion. Neck supple.  Cardiovascular: Normal rate and regular rhythm.   Pulmonary/Chest: Effort normal and breath sounds with mild decreased BS, wheezing bilat Neurological: Pt is alert. Not confused  Skin: Skin is warm. No erythema.  Psychiatric: Pt behavior is normal. Thought content normal.     Assessment & Plan:

## 2012-03-12 NOTE — Assessment & Plan Note (Signed)
Mild to mod, for predpack asd, and cough med prn,  to f/u any worsening symptoms or concerns

## 2012-03-17 ENCOUNTER — Telehealth: Payer: Self-pay

## 2012-03-17 NOTE — Telephone Encounter (Signed)
This is often typical for some wt loss when ill due to lower appetite, and some persistent cough even after the antibx for even a few more wks  The wt often goes back to previous wt soon after start feeling better and appetite returns  OK to cont to monitor without further tx at this time, unless cough becomes more productive or fever, or further wt loss

## 2012-03-17 NOTE — Telephone Encounter (Signed)
Called the patient informed of MD's response.  She is still concerned that she feels like she is in a fog all the time, is this normal??

## 2012-03-17 NOTE — Telephone Encounter (Signed)
Hard to say exactly, but I suspect in her case this is to be expected

## 2012-03-17 NOTE — Telephone Encounter (Signed)
The patient left message stating she has completed the levaquin.  She is still coughing some, but is more concerned that she has lost 5lbs. Since being seen, has no appetite, some fogginess and states just not herself.  Please advise.

## 2012-03-18 MED ORDER — AZITHROMYCIN 250 MG PO TABS: ORAL_TABLET | ORAL | Status: DC

## 2012-03-18 NOTE — Telephone Encounter (Signed)
Informed the patient of MD response.  She is requesting to have a zpack sent in as still has some congestion and productive cough.

## 2012-03-21 NOTE — Telephone Encounter (Signed)
Patient informed and had picked up zpack and doing much better.

## 2012-07-24 IMAGING — CT CT PARANASAL SINUSES LIMITED
1 of 2 series · 13 of 30 positions shown, 17 images · non-contrast
Comparison: CT temporal bone without contrast 06/14/2006

CLINICAL DATA: Pressure and headache.  Evaluate for sinusitis.
Right-sided facial tenderness.

CT PARANASAL SINUSES WITHOUT CONTRAST
TECHNIQUE: Multidetector CT through the paranasal sinuses was
performed using the standard protocol without intravenous contrast.

[Series 4: ltd sinus 3.0 h30s · axial · 0.34mm/px · z∈[-78,-0]mm · 13 of 15 slices shown, 17 images]
[im 2/15  brain]
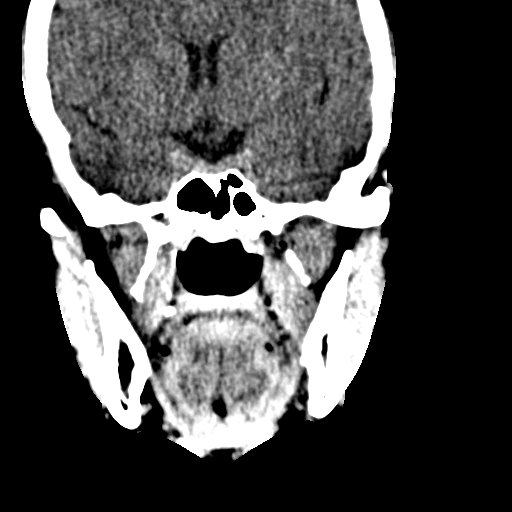
[im 2/15  bone]
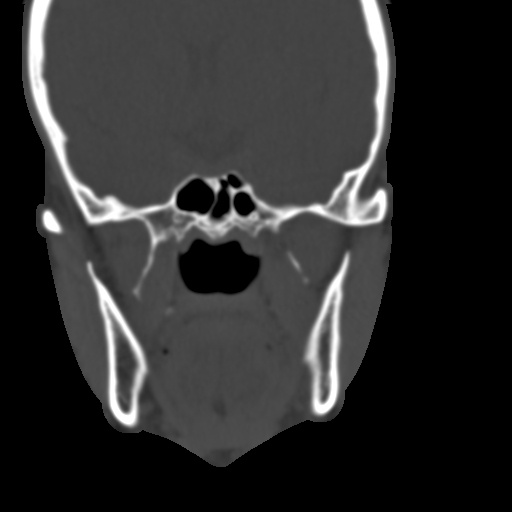
[im 3/15  bone]
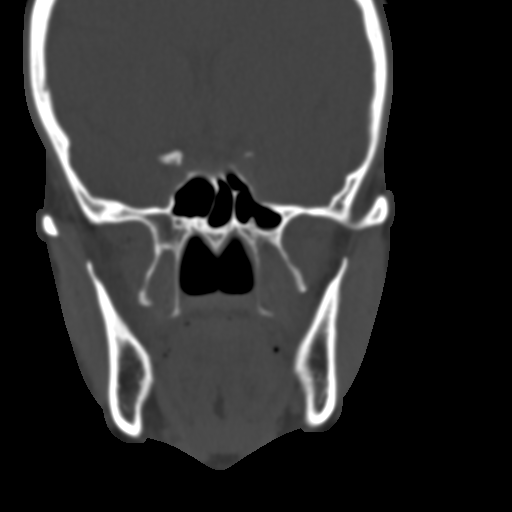
[im 4/15  bone]
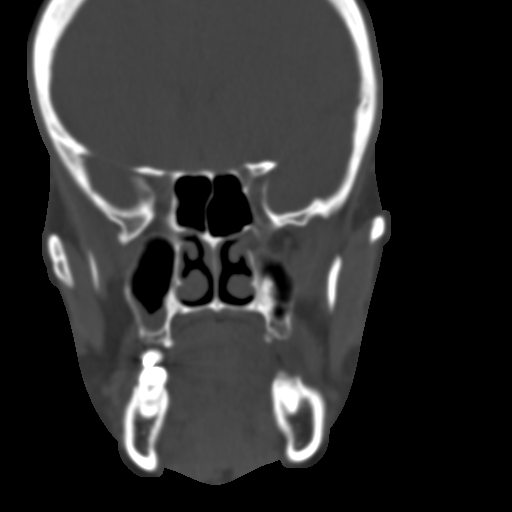
[im 5/15  bone]
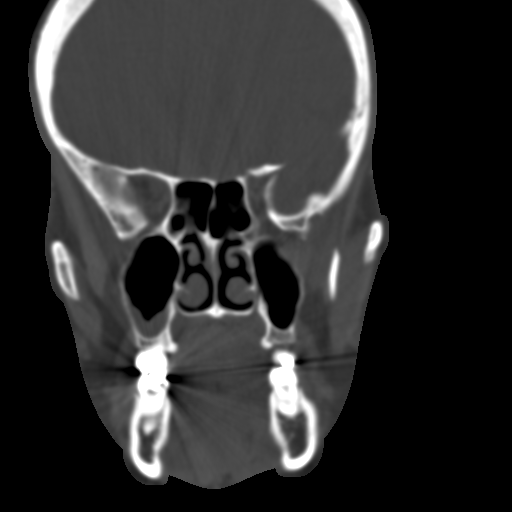
[im 6/15  brain]
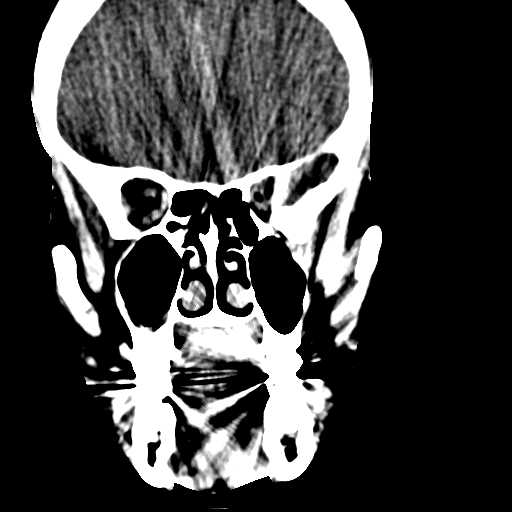
[im 6/15  bone]
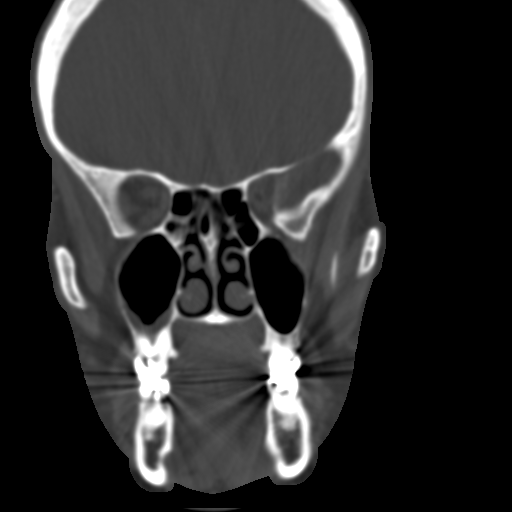
[im 7/15  bone]
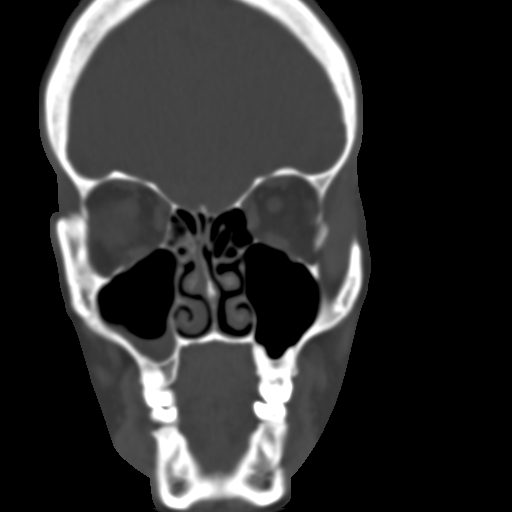
[im 8/15  bone]
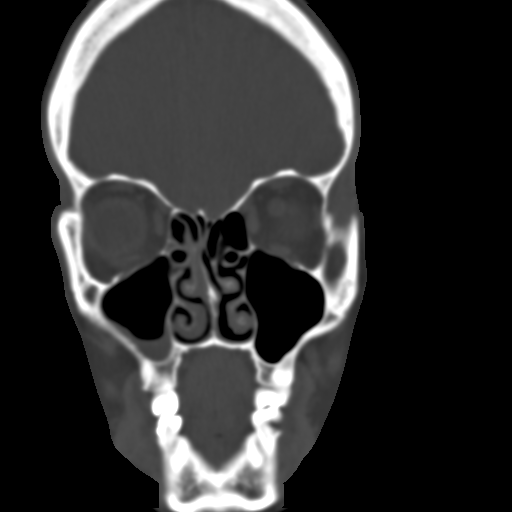
[im 9/15  bone]
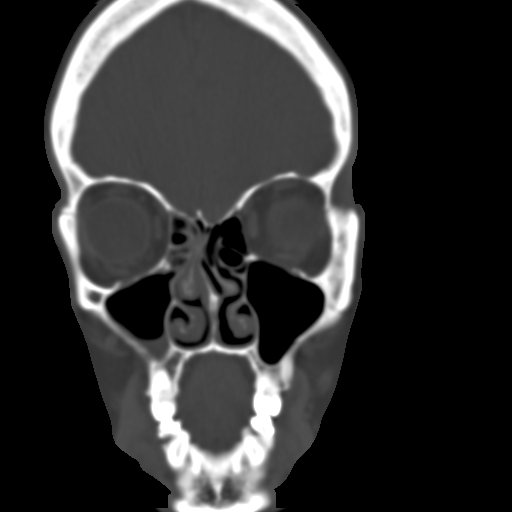
[im 10/15  brain]
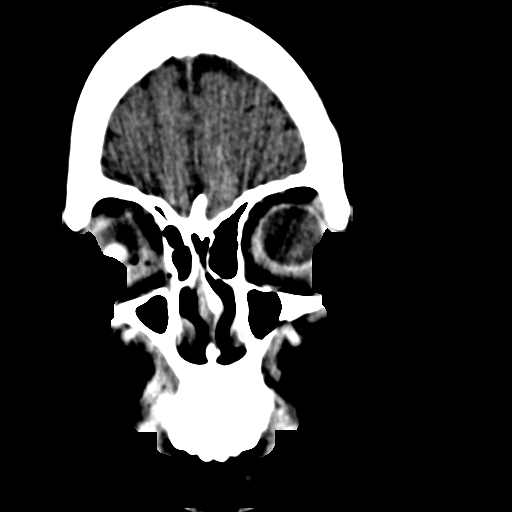
[im 10/15  bone]
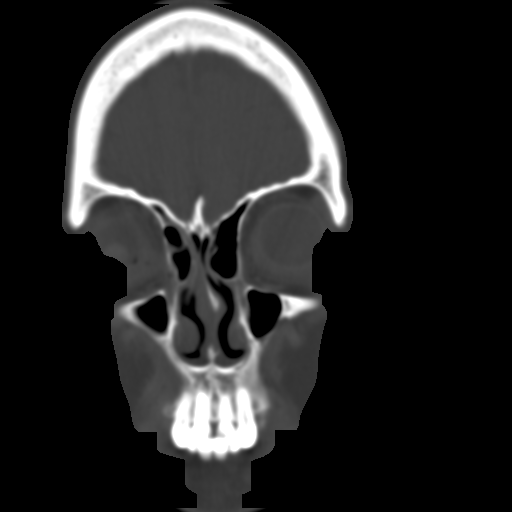
[im 11/15  bone]
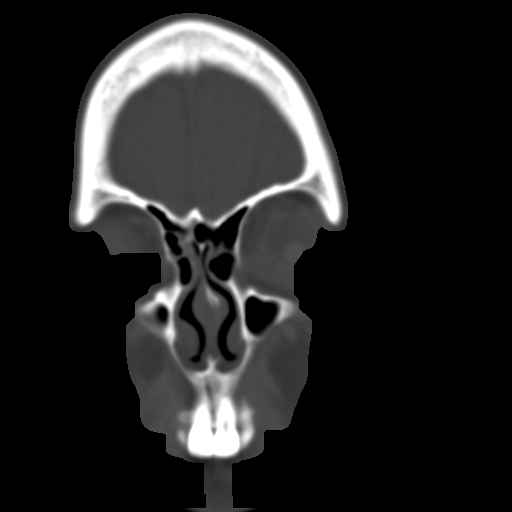
[im 12/15  bone]
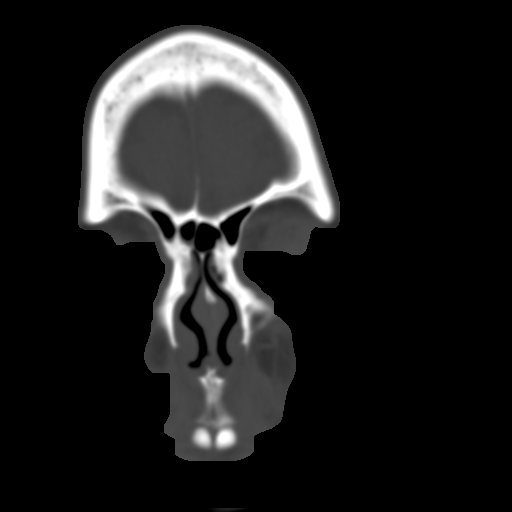
[im 13/15  bone]
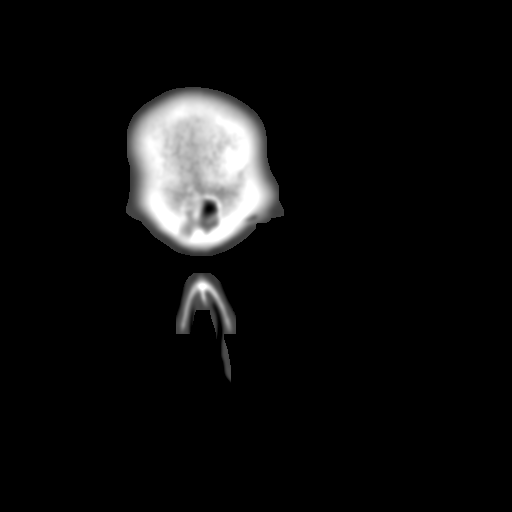
[im 14/15  brain]
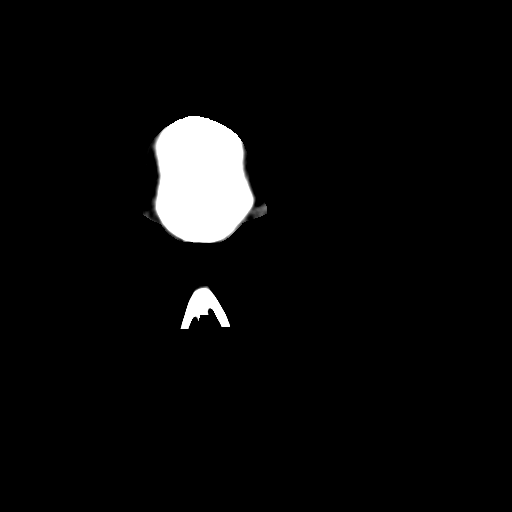
[im 14/15  bone]
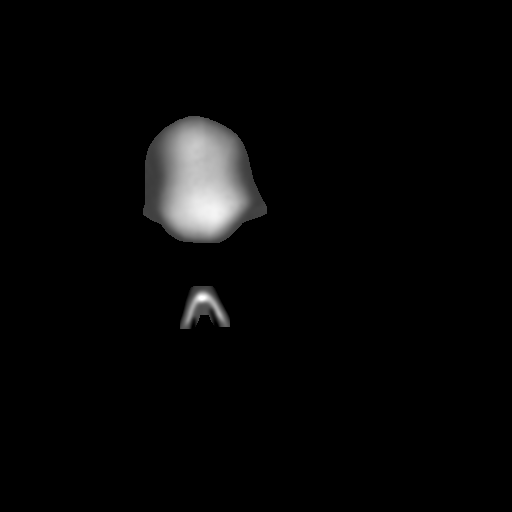

[13 of 30 positions shown; findings below may reference images not displayed]

FINDINGS: There is fluid and possibly some mucosal thickening in
the inferior aspect of the right maxillary sinus.  There are some
secretions within the posterior right ethmoid air cells.  There is
a small amount of fluid in the inferior aspect of the right frontal
sinus.

The left frontal, left maxillary, and left ethmoid air cells are
clear.  The sphenoid sinuses are clear.  Incidentally imaged
portion of the brain shows no evidence of midline shift or
ventriculomegaly.
IMPRESSION: Fluid in the right maxillary sinus, right ethmoid air cells, and
right frontal sinus.  Findings compatible with acute sinusitis.
Element of chronic sinusitis is not excluded.

## 2013-03-27 ENCOUNTER — Encounter: Payer: Self-pay | Admitting: Gynecology

## 2013-03-27 ENCOUNTER — Ambulatory Visit (INDEPENDENT_AMBULATORY_CARE_PROVIDER_SITE_OTHER): Payer: BC Managed Care – PPO | Admitting: Gynecology

## 2013-03-27 VITALS — BP 110/70 | Ht 66.0 in | Wt 127.0 lb

## 2013-03-27 DIAGNOSIS — Z01419 Encounter for gynecological examination (general) (routine) without abnormal findings: Secondary | ICD-10-CM

## 2013-03-27 DIAGNOSIS — Z30431 Encounter for routine checking of intrauterine contraceptive device: Secondary | ICD-10-CM

## 2013-03-27 LAB — CBC WITH DIFFERENTIAL/PLATELET
Basophils Absolute: 0 10*3/uL (ref 0.0–0.1)
Basophils Relative: 1 % (ref 0–1)
Eosinophils Absolute: 0 10*3/uL (ref 0.0–0.7)
Eosinophils Relative: 1 % (ref 0–5)
HCT: 36.6 % (ref 36.0–46.0)
Hemoglobin: 12.5 g/dL (ref 12.0–15.0)
Lymphocytes Relative: 42 % (ref 12–46)
Lymphs Abs: 2.6 10*3/uL (ref 0.7–4.0)
MCH: 31.9 pg (ref 26.0–34.0)
MCHC: 34.2 g/dL (ref 30.0–36.0)
MCV: 93.4 fL (ref 78.0–100.0)
Monocytes Absolute: 0.4 10*3/uL (ref 0.1–1.0)
Monocytes Relative: 6 % (ref 3–12)
Neutro Abs: 3.2 10*3/uL (ref 1.7–7.7)
Neutrophils Relative %: 50 % (ref 43–77)
Platelets: 230 10*3/uL (ref 150–400)
RBC: 3.92 MIL/uL (ref 3.87–5.11)
RDW: 13.4 % (ref 11.5–15.5)
WBC: 6.2 10*3/uL (ref 4.0–10.5)

## 2013-03-27 LAB — COMPREHENSIVE METABOLIC PANEL
ALT: 10 U/L (ref 0–35)
AST: 14 U/L (ref 0–37)
Albumin: 4.4 g/dL (ref 3.5–5.2)
Alkaline Phosphatase: 29 U/L — ABNORMAL LOW (ref 39–117)
BUN: 21 mg/dL (ref 6–23)
CO2: 27 mEq/L (ref 19–32)
Calcium: 9.4 mg/dL (ref 8.4–10.5)
Chloride: 106 mEq/L (ref 96–112)
Creat: 0.71 mg/dL (ref 0.50–1.10)
Glucose, Bld: 75 mg/dL (ref 70–99)
Potassium: 3.9 mEq/L (ref 3.5–5.3)
Sodium: 140 mEq/L (ref 135–145)
Total Bilirubin: 0.6 mg/dL (ref 0.3–1.2)
Total Protein: 6.6 g/dL (ref 6.0–8.3)

## 2013-03-27 LAB — LIPID PANEL
Cholesterol: 139 mg/dL (ref 0–200)
HDL: 63 mg/dL (ref >39)
LDL Cholesterol: 65 mg/dL (ref 0–99)
Total CHOL/HDL Ratio: 2.2 Ratio
Triglycerides: 57 mg/dL (ref <150)
VLDL: 11 mg/dL (ref 0–40)

## 2013-03-27 NOTE — Progress Notes (Signed)
Mary Mcneil 1975-12-04 [id number]        37 y.o.  G3P3 for annual exam.  Doing well without complaints.  Past medical history,surgical history, problem list, medications, allergies, family history and social history were all reviewed and documented in the EPIC chart.  ROS:  Performed and pertinent positives and negatives are included in the history, assessment and plan .  Exam: Kim assistant Filed Vitals:   03/27/13 0949  BP: 110/70  Height: 5\' 6"  (1.676 m)  Weight: 127 lb (57.607 kg)   General appearance  Normal Skin grossly normal Head/Neck normal with no cervical or supraclavicular adenopathy thyroid normal Lungs  clear Cardiac RR, without RMG Abdominal  soft, nontender, without masses, organomegaly or hernia Breasts  examined lying and sitting without masses, retractions, discharge or axillary adenopathy. Pelvic  Ext/BUS/vagina  normal  Cervix  normal with IUD string visualized in the canal  Uterus  anteverted, normal size, shape and contour, midline and mobile nontender   Adnexa  Without masses or tenderness    Anus and perineum  normal   Rectovaginal  normal sphincter tone without palpated masses or tenderness.    Assessment/Plan:  37 y.o. G3P3 female for annual exam, amenorrheic, Mirena IUD.   1. Mirena IUD 08/2009. Doing well. We'll continue to monitor. 2. Breast health. SBE monthly reviewed. Screening mammographic recommendations between 35 and 40 were reviewed. Patient plans to wait till 43 her baseline which I think is reasonable. 3. Pap smear 03/2011. No Pap smear done today. No history of abnormal Pap smears previously. Plan repeat Pap smear next year at 3 year interval. 4. Health maintenance. Baseline CBC comprehensive metabolic panel lipid profile urinalysis ordered. Followup one year, sooner as needed.  Note: This document was prepared with digital dictation and possible smart phrase technology. Any transcriptional errors that result from this process are  unintentional.   Dara Lords MD, 10:32 AM 03/27/2013

## 2013-03-27 NOTE — Patient Instructions (Signed)
Followup in one year for annual exam, sooner if any issues 

## 2013-03-28 LAB — URINALYSIS W MICROSCOPIC + REFLEX CULTURE
Bilirubin Urine: NEGATIVE
Casts: NONE SEEN
Crystals: NONE SEEN
Glucose, UA: NEGATIVE mg/dL
Hgb urine dipstick: NEGATIVE
Ketones, ur: NEGATIVE mg/dL
Nitrite: NEGATIVE
Protein, ur: NEGATIVE mg/dL
Specific Gravity, Urine: 1.026 (ref 1.005–1.030)
Urobilinogen, UA: 0.2 mg/dL (ref 0.0–1.0)
pH: 5.5 (ref 5.0–8.0)

## 2013-03-30 ENCOUNTER — Other Ambulatory Visit: Payer: Self-pay | Admitting: Gynecology

## 2013-03-30 MED ORDER — NITROFURANTOIN MONOHYD MACRO 100 MG PO CAPS: 100.0000 mg | ORAL_CAPSULE | Freq: Two times a day (BID) | ORAL | Status: DC

## 2013-03-31 LAB — URINE CULTURE

## 2014-01-12 ENCOUNTER — Other Ambulatory Visit (INDEPENDENT_AMBULATORY_CARE_PROVIDER_SITE_OTHER): Payer: BC Managed Care – PPO

## 2014-01-12 ENCOUNTER — Encounter: Payer: Self-pay | Admitting: Internal Medicine

## 2014-01-12 ENCOUNTER — Ambulatory Visit (INDEPENDENT_AMBULATORY_CARE_PROVIDER_SITE_OTHER): Payer: BC Managed Care – PPO | Admitting: Internal Medicine

## 2014-01-12 VITALS — BP 98/78 | HR 57 | Temp 98.5°F | Wt 125.2 lb

## 2014-01-12 DIAGNOSIS — H5500 Unspecified nystagmus: Secondary | ICD-10-CM

## 2014-01-12 DIAGNOSIS — H5789 Other specified disorders of eye and adnexa: Secondary | ICD-10-CM

## 2014-01-12 NOTE — Patient Instructions (Signed)
Please use natural tears 2 times a day to cleanse the eyes. Strict hand hygiene before and after using the natural tears as discussed  Zyrtec trial @ bedtime

## 2014-01-12 NOTE — Progress Notes (Signed)
Pre visit review using our clinic review tool, if applicable. No additional management support is needed unless otherwise documented below in the visit note. 

## 2014-01-12 NOTE — Progress Notes (Signed)
   Subjective:    Patient ID: Mary Mcneil, female    DOB: 08-05-1975, 38 y.o.   MRN: [id number]  HPI   For 2 weeks she's noted her eyes to be red and puffy particularly medially in the morning upon awakening. She feels as if there is some matter in her eyes but does not see any purulence.  Additionally for 2 months she's had intermittent fasciculations throughout the day below the left eye. She denies excessive intake of any stimulants  She denies any associated upper respiratory tract infection symptoms.   She has no neuromuscular symptoms either.    Review of Systems Frontal headache, facial pain , nasal purulence, dental pain, sore throat , otic pain or otic discharge denied. No fever , chills or sweats.Extrinsic symptoms of itchy, watery eyes, sneezing, or angioedema are denied. There is no significant cough, sputum production, wheezing,or  paroxysmal nocturnal dyspnea. No fever, chills, significant weight change, fatigue, weakness or night sweats. No blurred vision, double vision, or loss of vision No headache, vertigo, limb weakness, tremor, gait disturbance, seizures, memory loss, numbness or tingling      Objective:   Physical Exam  General appearance:good health ;well nourished; no acute distress or increased work of breathing is present.  No  lymphadenopathy about the head, neck, or axilla noted.   Eyes: No conjunctival inflammation or lid edema is present. There is no scleral icterus. EOM & vision intact  Ears:  External ear exam shows no significant lesions or deformities.  Otoscopic examination reveals clear canals, tympanic membranes are intact bilaterally without bulging, retraction, inflammation or discharge.  Nose:  External nasal examination shows no deformity or inflammation. Nasal mucosa are pink and moist without lesions or exudates. No septal dislocation or deviation.No obstruction to airflow.   Oral exam: Dental hygiene is good; lips and gums are healthy  appearing.There is no oropharyngeal erythema or exudate noted.   Neck:  No deformities, thyromegaly, masses, or tenderness noted.   Supple with full range of motion without pain.   Heart:  Normal rate and regular rhythm. S1 and S2 normal without gallop, murmur, click, rub or other extra sounds.   Lungs:Chest clear to auscultation; no wheezes, rhonchi,rales ,or rubs present.No increased work of breathing.    Extremities:  No cyanosis, edema, or clubbing  noted    Skin: Warm & dry w/o jaundice or tenting.  Strength , tone & DTRs WNL.No cranial nerve deficit.         Assessment & Plan:  #1 swelling of the eyes in the morning; an allergic reaction is suggested although there is no clear trigger in her history.  #2 fasciculations below the left eye.  Plan: See orders and recommendations in the after visit summary.  If the symptoms are not better using natural tears at bedtime in the morning and Zyrtec at bedtime; ophthalmology assessment is recommended.  If the fasciculations persist or progress; consultation with Dr. Arbutus Leasat is recommended. This was discussed

## 2014-01-13 LAB — BASIC METABOLIC PANEL
BUN: 23 mg/dL (ref 6–23)
CO2: 25 mEq/L (ref 19–32)
Calcium: 9.3 mg/dL (ref 8.4–10.5)
Chloride: 106 mEq/L (ref 96–112)
Creatinine, Ser: 0.9 mg/dL (ref 0.4–1.2)
GFR: 76.98 mL/min (ref >60.00)
Glucose, Bld: 78 mg/dL (ref 70–99)
Potassium: 4.4 mEq/L (ref 3.5–5.1)
Sodium: 141 mEq/L (ref 135–145)

## 2014-01-13 LAB — MAGNESIUM: Magnesium: 2 mg/dL (ref 1.5–2.5)

## 2014-01-13 LAB — CK: Total CK: 65 U/L (ref 7–177)

## 2014-01-13 LAB — VITAMIN D 25 HYDROXY (VIT D DEFICIENCY, FRACTURES): VITD: 21 ng/mL — ABNORMAL LOW (ref 30.00–100.00)

## 2014-03-09 ENCOUNTER — Other Ambulatory Visit: Payer: Self-pay

## 2014-03-26 ENCOUNTER — Encounter: Payer: Self-pay | Admitting: Internal Medicine

## 2014-04-16 ENCOUNTER — Encounter: Payer: Self-pay | Admitting: Gynecology

## 2014-04-16 ENCOUNTER — Encounter: Payer: BC Managed Care – PPO | Admitting: Gynecology

## 2014-10-12 ENCOUNTER — Encounter: Payer: Self-pay | Admitting: Gynecology

## 2014-10-12 ENCOUNTER — Ambulatory Visit (INDEPENDENT_AMBULATORY_CARE_PROVIDER_SITE_OTHER): Payer: BLUE CROSS/BLUE SHIELD | Admitting: Gynecology

## 2014-10-12 ENCOUNTER — Other Ambulatory Visit (HOSPITAL_COMMUNITY)
Admission: RE | Admit: 2014-10-12 | Discharge: 2014-10-12 | Disposition: A | Discharge status: Home / Self Care | Payer: BLUE CROSS/BLUE SHIELD | Source: Ambulatory Visit | Attending: Gynecology | Admitting: Gynecology

## 2014-10-12 VITALS — BP 110/70 | Ht 66.0 in | Wt 123.0 lb

## 2014-10-12 DIAGNOSIS — Z30431 Encounter for routine checking of intrauterine contraceptive device: Secondary | ICD-10-CM

## 2014-10-12 DIAGNOSIS — Z01411 Encounter for gynecological examination (general) (routine) with abnormal findings: Secondary | ICD-10-CM | POA: Insufficient documentation

## 2014-10-12 DIAGNOSIS — Z01419 Encounter for gynecological examination (general) (routine) without abnormal findings: Secondary | ICD-10-CM | POA: Diagnosis not present

## 2014-10-12 DIAGNOSIS — Z1151 Encounter for screening for human papillomavirus (HPV): Secondary | ICD-10-CM | POA: Insufficient documentation

## 2014-10-12 LAB — COMPREHENSIVE METABOLIC PANEL
ALT: 9 U/L (ref 0–35)
AST: 14 U/L (ref 0–37)
Albumin: 4 g/dL (ref 3.5–5.2)
Alkaline Phosphatase: 30 U/L — ABNORMAL LOW (ref 39–117)
BUN: 12 mg/dL (ref 6–23)
CALCIUM: 9.2 mg/dL (ref 8.4–10.5)
CHLORIDE: 104 meq/L (ref 96–112)
CO2: 25 mEq/L (ref 19–32)
Creat: 0.77 mg/dL (ref 0.50–1.10)
Glucose, Bld: 90 mg/dL (ref 70–99)
POTASSIUM: 4 meq/L (ref 3.5–5.3)
Sodium: 138 mEq/L (ref 135–145)
Total Bilirubin: 0.7 mg/dL (ref 0.2–1.2)
Total Protein: 6.6 g/dL (ref 6.0–8.3)

## 2014-10-12 LAB — CBC WITH DIFFERENTIAL/PLATELET
BASOS ABS: 0.1 10*3/uL (ref 0.0–0.1)
BASOS PCT: 1 % (ref 0–1)
EOS PCT: 1 % (ref 0–5)
Eosinophils Absolute: 0.1 10*3/uL (ref 0.0–0.7)
HCT: 36.9 % (ref 36.0–46.0)
Hemoglobin: 12.6 g/dL (ref 12.0–15.0)
Lymphocytes Relative: 33 % (ref 12–46)
Lymphs Abs: 2.1 10*3/uL (ref 0.7–4.0)
MCH: 31.5 pg (ref 26.0–34.0)
MCHC: 34.1 g/dL (ref 30.0–36.0)
MCV: 92.3 fL (ref 78.0–100.0)
MPV: 11.3 fL (ref 8.6–12.4)
Monocytes Absolute: 0.5 10*3/uL (ref 0.1–1.0)
Monocytes Relative: 8 % (ref 3–12)
Neutro Abs: 3.7 10*3/uL (ref 1.7–7.7)
Neutrophils Relative %: 57 % (ref 43–77)
PLATELETS: 231 10*3/uL (ref 150–400)
RBC: 4 MIL/uL (ref 3.87–5.11)
RDW: 13.2 % (ref 11.5–15.5)
WBC: 6.5 10*3/uL (ref 4.0–10.5)

## 2014-10-12 LAB — LIPID PANEL
Cholesterol: 163 mg/dL (ref 0–200)
HDL: 67 mg/dL
LDL Cholesterol: 88 mg/dL (ref 0–99)
Total CHOL/HDL Ratio: 2.4 ratio
Triglycerides: 42 mg/dL
VLDL: 8 mg/dL (ref 0–40)

## 2014-10-12 NOTE — Progress Notes (Addendum)
Anibal HendersonShannon B Kelso July 30, 1975 [id number]        39 y.o.  G3P3 for annual exam. Missed her exam last year. Several issues noted below.  Past medical history,surgical history, problem list, medications, allergies, family history and social history were all reviewed and documented as reviewed in the EPIC chart.  ROS:  Performed with pertinent positives and negatives included in the history, assessment and plan.   Additional significant findings :  none   Exam: [facility] personassistant Filed Vitals:   10/12/14 1117  BP: 110/70  Height: 5\' 6"  (1.676 m)  Weight: 123 lb (55.792 kg)   General appearance:  Normal affect, orientation and appearance. Skin: Grossly normal HEENT: Without gross lesions.  No cervical or supraclavicular adenopathy. Thyroid normal.  Lungs:  Clear without wheezing, rales or rhonchi Cardiac: RR, without RMG Abdominal:  Soft, nontender, without masses, guarding, rebound, organomegaly or hernia Breasts:  Examined lying and sitting without masses, retractions, discharge or axillary adenopathy. Pelvic:  Ext/BUS/vagina normal  Cervix normal. IUD string visualized. Pap smear/HPV done  Uterus anteverted, normal size, shape and contour, midline and mobile nontender   Adnexa  Without masses or tenderness    Anus and perineum  Normal   Rectovaginal  Normal sphincter tone without palpated masses or tenderness.    Assessment/Plan:  39 y.o. G3P3 female for annual exam without menses, Mirena IUD.   1. Mirena IUD 08/2009. Due to be replaced now and patient will make an appointment and come back in to have this done. Doing well without menses. 2. Pap smear 2012.  Pap smear/HPV done today. No history of significant abnormal Pap smears. 3. Recommended screening mammogram when she turns 39 and patient agrees to arrange. SBE monthly reviewed. 4. Health maintenance. Baseline CBC comprehensive metabolic panel lipid profile urinalysis ordered. Follow up for IUD replacement exam otherwise  annually.     Dara LordsFONTAINE,Elliet Goodnow P MD, 11:31 AM 10/12/2014

## 2014-10-12 NOTE — Addendum Note (Signed)
Addended by: Dayna BarkerGARDNER, Laurissa Cowper K on: 10/12/2014 12:03 PM   Modules accepted: Orders

## 2014-10-12 NOTE — Patient Instructions (Signed)
Follow up for your IUD replacement exam.  Call to Schedule your mammogram  Facilities in [place]: 1)  [facility], [address] Phone: [phone] 2)  [facility]. [address] [place] Phone: [phone] 3)  Dr. Isaiah Blakes at [facility] [address] Phone: [phone]     Mammogram A mammogram is an X-ray test to find changes in a woman's breast. You should get a mammogram if:  You are 41 years of age or older  You have risk factors.   Your doctor recommends that you have one.  BEFORE THE TEST  Do not schedule the test the week before your period, especially if your breasts are sore during this time.  On the day of your mammogram:  Wash your breasts and armpits well. After washing, do not put on any deodorant or talcum powder on until after your test.   Eat and drink as you usually do.   Take your medicines as usual.   If you are diabetic and take insulin, make sure you:   Eat before coming for your test.   Take your insulin as usual.   If you cannot keep your appointment, call before the appointment to cancel. Schedule another appointment.  TEST  You will need to undress from the waist up. You will put on a hospital gown.   Your breast will be put on the mammogram machine, and it will press firmly on your breast with a piece of plastic called a compression paddle. This will make your breast flatter so that the machine can X-ray all parts of your breast.   Both breasts will be X-rayed. Each breast will be X-rayed from above and from the side. An X-ray might need to be taken again if the picture is not good enough.   The mammogram will last about 15 to 30 minutes.  AFTER THE TEST Finding out the results of your test Ask when your test results will be ready. Make sure you get your test results.  Document Released: 08/07/2008 Document Revised: 04/30/2011 Document  Reviewed: 08/07/2008 [facility] Patient Information 2012 [place].  You may obtain a copy of any labs that were done today by logging onto MyChart as outlined in the instructions provided with your AVS (after visit summary). The office will not call with normal lab results but certainly if there are any significant abnormalities then we will contact you.   Health Maintenance, Female A healthy lifestyle and preventative care can promote health and wellness.  Maintain regular health, dental, and eye exams.  Eat a healthy diet. Foods like vegetables, fruits, whole grains, low-fat dairy products, and lean protein foods contain the nutrients you need without too many calories. Decrease your intake of foods high in solid fats, added sugars, and salt. Get information about a proper diet from your caregiver, if necessary.  Regular physical exercise is one of the most important things you can do for your health. Most adults should get at least 150 minutes of moderate-intensity exercise (any activity that increases your heart rate and causes you to sweat) each week. In addition, most adults need muscle-strengthening exercises on 2 or more days a week.   Maintain a healthy weight. The body mass index (BMI) is a screening tool to identify possible weight problems. It provides an estimate of body fat based on height and weight. Your caregiver can help determine your BMI, and can help you  achieve or maintain a healthy weight. For adults 20 years and older:  A BMI below 18.5 is considered underweight.  A BMI of 18.5 to 24.9 is normal.  A BMI of 25 to 29.9 is considered overweight.  A BMI of 30 and above is considered obese.  Maintain normal blood lipids and cholesterol by exercising and minimizing your intake of saturated fat. Eat a balanced diet with plenty of fruits and vegetables. Blood tests for lipids and cholesterol should begin at age 100 and be repeated every 5 years. If your lipid or  cholesterol levels are high, you are over 50, or you are a high risk for heart disease, you may need your cholesterol levels checked more frequently.Ongoing high lipid and cholesterol levels should be treated with medicines if diet and exercise are not effective.  If you smoke, find out from your caregiver how to quit. If you do not use tobacco, do not start.  Lung cancer screening is recommended for adults aged 64 80 years who are at high risk for developing lung cancer because of a history of smoking. Yearly low-dose computed tomography (CT) is recommended for people who have at least a 30-pack-year history of smoking and are a current smoker or have quit within the past 15 years. A pack year of smoking is smoking an average of 1 pack of cigarettes a day for 1 year (for example: 1 pack a day for 30 years or 2 packs a day for 15 years). Yearly screening should continue until the smoker has stopped smoking for at least 15 years. Yearly screening should also be stopped for people who develop a health problem that would prevent them from having lung cancer treatment.  If you are pregnant, do not drink alcohol. If you are breastfeeding, be very cautious about drinking alcohol. If you are not pregnant and choose to drink alcohol, do not exceed 1 drink per day. One drink is considered to be 12 ounces (355 mL) of beer, 5 ounces (148 mL) of wine, or 1.5 ounces (44 mL) of liquor.  Avoid use of street drugs. Do not share needles with anyone. Ask for help if you need support or instructions about stopping the use of drugs.  High blood pressure causes heart disease and increases the risk of stroke. Blood pressure should be checked at least every 1 to 2 years. Ongoing high blood pressure should be treated with medicines, if weight loss and exercise are not effective.  If you are 2 to 40 years old, ask your caregiver if you should take aspirin to prevent strokes.  Diabetes screening involves taking a blood sample  to check your fasting blood sugar level. This should be done once every 3 years, after age 19, if you are within normal weight and without risk factors for diabetes. Testing should be considered at a younger age or be carried out more frequently if you are overweight and have at least 1 risk factor for diabetes.  Breast cancer screening is essential preventative care for women. You should practice "breast self-awareness." This means understanding the normal appearance and feel of your breasts and may include breast self-examination. Any changes detected, no matter how small, should be reported to a caregiver. Women in their 56s and 30s should have a clinical breast exam (CBE) by a caregiver as part of a regular health exam every 1 to 3 years. After age 51, women should have a CBE every year. Starting at age 46, women should consider having a mammogram (  breast X-ray) every year. Women who have a family history of breast cancer should talk to their caregiver about genetic screening. Women at a high risk of breast cancer should talk to their caregiver about having an MRI and a mammogram every year.  Breast cancer gene (BRCA)-related cancer risk assessment is recommended for women who have family members with BRCA-related cancers. BRCA-related cancers include breast, ovarian, tubal, and peritoneal cancers. Having family members with these cancers may be associated with an increased risk for harmful changes (mutations) in the breast cancer genes BRCA1 and BRCA2. Results of the assessment will determine the need for genetic counseling and BRCA1 and BRCA2 testing.  The Pap test is a screening test for cervical cancer. Women should have a Pap test starting at age 65. Between ages 10 and 33, Pap tests should be repeated every 2 years. Beginning at age 46, you should have a Pap test every 3 years as long as the past 3 Pap tests have been normal. If you had a hysterectomy for a problem that was not cancer or a condition  that could lead to cancer, then you no longer need Pap tests. If you are between ages 46 and 18, and you have had normal Pap tests going back 10 years, you no longer need Pap tests. If you have had past treatment for cervical cancer or a condition that could lead to cancer, you need Pap tests and screening for cancer for at least 20 years after your treatment. If Pap tests have been discontinued, risk factors (such as a new sexual partner) need to be reassessed to determine if screening should be resumed. Some women have medical problems that increase the chance of getting cervical cancer. In these cases, your caregiver may recommend more frequent screening and Pap tests.  The human papillomavirus (HPV) test is an additional test that may be used for cervical cancer screening. The HPV test looks for the virus that can cause the cell changes on the cervix. The cells collected during the Pap test can be tested for HPV. The HPV test could be used to screen women aged 73 years and older, and should be used in women of any age who have unclear Pap test results. After the age of 76, women should have HPV testing at the same frequency as a Pap test.  Colorectal cancer can be detected and often prevented. Most routine colorectal cancer screening begins at the age of 45 and continues through age 41. However, your caregiver may recommend screening at an earlier age if you have risk factors for colon cancer. On a yearly basis, your caregiver may provide home test kits to check for hidden blood in the stool. Use of a small camera at the end of a tube, to directly examine the colon (sigmoidoscopy or colonoscopy), can detect the earliest forms of colorectal cancer. Talk to your caregiver about this at age 38, when routine screening begins. Direct examination of the colon should be repeated every 5 to 10 years through age 23, unless early forms of pre-cancerous polyps or small growths are found.  Hepatitis C blood testing is  recommended for all people born from [birth year] through 1966 and any individual with known risks for hepatitis C.  Practice safe sex. Use condoms and avoid high-risk sexual practices to reduce the spread of sexually transmitted infections (STIs). Sexually active women aged 57 and younger should be checked for Chlamydia, which is a common sexually transmitted infection. Older women with new or multiple partners  should also be tested for Chlamydia. Testing for other STIs is recommended if you are sexually active and at increased risk.  Osteoporosis is a disease in which the bones lose minerals and strength with aging. This can result in serious bone fractures. The risk of osteoporosis can be identified using a bone density scan. Women ages 75 and over and women at risk for fractures or osteoporosis should discuss screening with their caregivers. Ask your caregiver whether you should be taking a calcium supplement or vitamin D to reduce the rate of osteoporosis.  Menopause can be associated with physical symptoms and risks. Hormone replacement therapy is available to decrease symptoms and risks. You should talk to your caregiver about whether hormone replacement therapy is right for you.  Use sunscreen. Apply sunscreen liberally and repeatedly throughout the day. You should seek shade when your shadow is shorter than you. Protect yourself by wearing long sleeves, pants, a wide-brimmed hat, and sunglasses year round, whenever you are outdoors.  Notify your caregiver of new moles or changes in moles, especially if there is a change in shape or color. Also notify your caregiver if a mole is larger than the size of a pencil eraser.  Stay current with your immunizations. Document Released: 11/24/2010 Document Revised: 09/05/2012 Document Reviewed: 11/24/2010 [facility] Patient Information 2014 [place].

## 2014-10-13 LAB — URINALYSIS W MICROSCOPIC + REFLEX CULTURE
Bacteria, UA: NONE SEEN
Bilirubin Urine: NEGATIVE
CRYSTALS: NONE SEEN
Casts: NONE SEEN
GLUCOSE, UA: NEGATIVE mg/dL
HGB URINE DIPSTICK: NEGATIVE
Ketones, ur: NEGATIVE mg/dL
Nitrite: NEGATIVE
PH: 6 (ref 5.0–8.0)
PROTEIN: NEGATIVE mg/dL
Specific Gravity, Urine: 1.01 (ref 1.005–1.030)
UROBILINOGEN UA: 0.2 mg/dL (ref 0.0–1.0)

## 2014-10-14 LAB — URINE CULTURE: Colony Count: 70000

## 2014-10-16 ENCOUNTER — Telehealth: Payer: Self-pay | Admitting: Gynecology

## 2014-10-16 LAB — CYTOLOGY - PAP

## 2014-10-16 NOTE — Telephone Encounter (Signed)
10/16/14-I LM VM for pt that her BC ins will cover the replacement of her Mirena IUD under her $25.00 copay. She will call to schedule this appt with [email or web address]

## 2014-11-19 ENCOUNTER — Ambulatory Visit (INDEPENDENT_AMBULATORY_CARE_PROVIDER_SITE_OTHER): Payer: BLUE CROSS/BLUE SHIELD | Admitting: Gynecology

## 2014-11-19 ENCOUNTER — Encounter: Payer: Self-pay | Admitting: Gynecology

## 2014-11-19 VITALS — BP 116/70

## 2014-11-19 DIAGNOSIS — Z30433 Encounter for removal and reinsertion of intrauterine contraceptive device: Secondary | ICD-10-CM

## 2014-11-19 DIAGNOSIS — Z30430 Encounter for insertion of intrauterine contraceptive device: Secondary | ICD-10-CM

## 2014-11-19 HISTORY — PX: INTRAUTERINE DEVICE INSERTION: SHX323

## 2014-11-19 MED ORDER — LEVONORGESTREL 20 MCG/24HR IU IUD: INTRAUTERINE_SYSTEM | Freq: Once | INTRAUTERINE | Status: AC

## 2014-11-19 NOTE — Progress Notes (Signed)
Patient presents for Mirena IUD removal and replacement. She has read through the booklet, has no contraindications and signed the consent form.  I reviewed the removal and insertional process with her as well as the risks to include infection, either immediate or long-term, uterine perforation or migration requiring surgery to remove, other complications such as pain, hormonal side effects and possibility of failure with subsequent pregnancy.   Exam with Kim assistant Pelvic: External BUS vagina normal. Cervix normal with IUD string visualized. Uterus anteverted normal size shape contour midline mobile nontender. Adnexa without masses or tenderness.  Procedure: The cervix was visualized with a speculum and the IUD string was grasped with a [place] forcep and her old Mirena IUD was removed, shown to the patient and discarded.  The cervix was then cleansed with Betadine, anterior lip grasped with a single-tooth tenaculum, the uterus was sounded and a Mirena IUD was placed according to manufacturer's recommendations without difficulty. The strings were trimmed. The patient tolerated well and will follow up in one month for a postinsertional check.  Lot number:  [id number]    Mary Mcneil,[name] P MD, 8:30 AM 11/19/2014

## 2014-11-19 NOTE — Patient Instructions (Signed)
Intrauterine Device Insertion Most often, an intrauterine device (IUD) is inserted into the uterus to prevent pregnancy. There are 2 types of IUDs available:  Copper IUD--This type of IUD creates an environment that is not favorable to sperm survival. The mechanism of action of the copper IUD is not known for certain. It can stay in place for 10 years.  Hormone IUD--This type of IUD contains the hormone progestin (synthetic progesterone). The progestin thickens the cervical mucus and prevents sperm from entering the uterus, and it also thins the uterine lining. There is no evidence that the hormone IUD prevents implantation. One hormone IUD can stay in place for up to 5 years, and a different hormone IUD can stay in place for up to 3 years. An IUD is the most cost-effective birth control if left in place for the full duration. It may be removed at any time. LET YOUR HEALTH CARE PROVIDER KNOW ABOUT:  Any allergies you have.  All medicines you are taking, including vitamins, herbs, eye drops, creams, and over-the-counter medicines.  Previous problems you or members of your family have had with the use of anesthetics.  Any blood disorders you have.  Previous surgeries you have had.  Possibility of pregnancy.  Medical conditions you have. RISKS AND COMPLICATIONS  Generally, intrauterine device insertion is a safe procedure. However, as with any procedure, complications can occur. Possible complications include:  Accidental puncture (perforation) of the uterus.  Accidental placement of the IUD either in the muscle layer of the uterus (myometrium) or outside the uterus. If this happens, the IUD can be found essentially floating around the bowels and must be taken out surgically.  The IUD may fall out of the uterus (expulsion). This is more common in women who have recently had a child.   Pregnancy in the fallopian tube (ectopic).  Pelvic inflammatory disease (PID), which is infection of  the uterus and fallopian tubes. The risk of PID is slightly increased in the first 20 days after the IUD is placed, but the overall risk is still very low. BEFORE THE PROCEDURE  Schedule the IUD insertion for when you will have your menstrual period or right after, to make sure you are not pregnant. Placement of the IUD is better tolerated shortly after a menstrual cycle.  You may need to take tests or be examined to make sure you are not pregnant.  You may be required to take a pregnancy test.  You may be required to get checked for sexually transmitted infections (STIs) prior to placement. Placing an IUD in someone who has an infection can make the infection worse.  You may be given a pain reliever to take 1 or 2 hours before the procedure.  An exam will be performed to determine the size and position of your uterus.  Ask your health care provider about changing or stopping your regular medicines. PROCEDURE   A tool (speculum) is placed in the vagina. This allows your health care provider to see the lower part of the uterus (cervix).  The cervix is prepped with a medicine that lowers the risk of infection.  You may be given a medicine to numb each side of the cervix (intracervical or paracervical block). This is used to block and control any discomfort with insertion.  A tool (uterine sound) is inserted into the uterus to determine the length of the uterine cavity and the direction the uterus may be tilted.  A slim instrument (IUD inserter) is inserted through the cervical   canal and into your uterus.  The IUD is placed in the uterine cavity and the insertion device is removed.  The nylon string that is attached to the IUD and used for eventual IUD removal is trimmed. It is trimmed so that it lays high in the vagina, just outside the cervix. AFTER THE PROCEDURE  You may have bleeding after the procedure. This is normal. It varies from light spotting for a few days to menstrual-like  bleeding.  You may have mild cramping. Document Released: 01/07/2011 Document Revised: 03/01/2013 Document Reviewed: 10/30/2012 ExitCare Patient Information 2015 [employer]. This information is not intended to replace advice given to you by your health care provider. Make sure you discuss any questions you have with your health care provider.  

## 2014-12-21 ENCOUNTER — Ambulatory Visit (INDEPENDENT_AMBULATORY_CARE_PROVIDER_SITE_OTHER): Payer: BLUE CROSS/BLUE SHIELD | Admitting: Gynecology

## 2014-12-21 ENCOUNTER — Encounter: Payer: Self-pay | Admitting: Gynecology

## 2014-12-21 VITALS — BP 116/70

## 2014-12-21 DIAGNOSIS — Z30431 Encounter for routine checking of intrauterine contraceptive device: Secondary | ICD-10-CM | POA: Diagnosis not present

## 2014-12-21 NOTE — Progress Notes (Signed)
Mary Mcneil 1975/08/28 [id number]        39 y.o.  Mary Mcneil presents for follow up IUD check after having her Mirena IUD replaced 11/19/2014. Doing well without complaints.  Past medical history,surgical history, problem list, medications, allergies, family history and social history were all reviewed and documented in the EPIC chart.  Directed ROS with pertinent positives and negatives documented in the history of present illness/assessment and plan.  Exam: Kim assistant Filed Vitals:   12/21/14 0759  BP: 116/70   General appearance:  Normal Abdomen soft nontender without masses guarding rebound Pelvic external BUS vagina normal. Cervix normal with IUD string visualized in appropriate length. Uterus normal size midline mobile nontender. Adnexa without masses or tenderness.  Assessment/Plan:  39 y.o. G3P3 with normal follow up IUD exam doing well without complaints. Follow up May 2017 when due for annual exam, sooner if any issues.    Dara Lords MD, 8:06 AM 12/21/2014

## 2014-12-21 NOTE — Patient Instructions (Signed)
Follow up in May 2017 for annual exam, sooner if any issues

## 2015-09-21 DIAGNOSIS — H524 Presbyopia: Secondary | ICD-10-CM | POA: Diagnosis not present

## 2015-11-29 ENCOUNTER — Encounter: Payer: BLUE CROSS/BLUE SHIELD | Admitting: Gynecology

## 2015-11-29 DIAGNOSIS — Z1231 Encounter for screening mammogram for malignant neoplasm of breast: Secondary | ICD-10-CM | POA: Diagnosis not present

## 2015-12-19 ENCOUNTER — Encounter: Payer: Self-pay | Admitting: Gynecology

## 2016-01-09 DIAGNOSIS — D2262 Melanocytic nevi of left upper limb, including shoulder: Secondary | ICD-10-CM | POA: Diagnosis not present

## 2016-01-09 DIAGNOSIS — D225 Melanocytic nevi of trunk: Secondary | ICD-10-CM | POA: Diagnosis not present

## 2016-01-09 DIAGNOSIS — D2261 Melanocytic nevi of right upper limb, including shoulder: Secondary | ICD-10-CM | POA: Diagnosis not present

## 2016-01-09 DIAGNOSIS — L719 Rosacea, unspecified: Secondary | ICD-10-CM | POA: Diagnosis not present

## 2016-06-19 ENCOUNTER — Ambulatory Visit (INDEPENDENT_AMBULATORY_CARE_PROVIDER_SITE_OTHER): Payer: BLUE CROSS/BLUE SHIELD | Admitting: Internal Medicine

## 2016-06-19 VITALS — BP 120/74 | HR 60 | Resp 20 | Wt 127.0 lb

## 2016-06-19 DIAGNOSIS — R002 Palpitations: Secondary | ICD-10-CM | POA: Diagnosis not present

## 2016-06-19 DIAGNOSIS — Z0000 Encounter for general adult medical examination without abnormal findings: Secondary | ICD-10-CM

## 2016-06-19 NOTE — Progress Notes (Signed)
Subjective:    Patient ID: Mary Mcneil, female    DOB: 1976/03/30, 41 y.o.   MRN: [id number]  HPI  Here for wellness and f/u;  Overall doing ok;  Pt denies Chest pain, worsening SOB, DOE, wheezing, orthopnea, PND, worsening LE edema, palpitations, dizziness or syncope.  Pt denies neurological change such as new headache, facial or extremity weakness.  Pt denies polydipsia, polyuria, or low sugar symptoms. Pt states overall good compliance with treatment and medications, good tolerability, and has been trying to follow appropriate diet.  Pt denies worsening depressive symptoms, suicidal ideation or panic. No fever, night sweats, wt loss, loss of appetite, or other constitutional symptoms.  Pt states good ability with ADL's, has low fall risk, home safety reviewed and adequate, no other significant changes in hearing or vision, and very active with exercise for age with Pilades. Declines flu shot  Here with concern of palpiations intermittent for years, maybe more freq and severe for several months, and mom said ot get checked out, and feels like a fluttering sensation that seems to rise up in the chest, no CP, sob but did have 2 episodes last with some sense of dizziness, and once was concerned about maybe passing out.  Works as Designer, jewellery, standing many hours. No n/v, diaphoresis.  No other new hx No past medical history on file. Past Surgical History:  Procedure Laterality Date  . INTRAUTERINE DEVICE INSERTION  11/19/2014   MIRENA    reports that she has quit smoking. She does not have any smokeless tobacco history on file. She reports that she drinks alcohol. She reports that she does not use drugs. family history includes Hypertension in her mother; Rheum arthritis in her maternal grandmother. Allergies  Allergen Reactions  . Paroxetine     REACTION: decreased libido  . Sulfonamide Derivatives     REACTION: decreased appetite  . Penicillins Rash   Current Outpatient Prescriptions  on File Prior to Visit  Medication Sig Dispense Refill  . Cholecalciferol (VITAMIN D PO) Take by mouth.    . levonorgestrel (MIRENA) 20 MCG/24HR IUD 1 each by Intrauterine route once. 07/2004. REPLACED 08/2009/      Current Facility-Administered Medications on File Prior to Visit  Medication Dose Route Frequency Provider Last Rate Last Dose  . levonorgestrel (MIRENA) 20 MCG/24HR IUD   Intrauterine Once Dara Lords, MD        Review of Systems Constitutional: Negative for increased diaphoresis, or other activity, appetite or siginficant weight change other than noted HENT: Negative for worsening hearing loss, ear pain, facial swelling, mouth sores and neck stiffness.   Eyes: Negative for other worsening pain, redness or visual disturbance.  Respiratory: Negative for choking or stridor Cardiovascular: Negative for other chest pain and palpitations.  Gastrointestinal: Negative for worsening diarrhea, blood in stool, or abdominal distention Genitourinary: Negative for hematuria, flank pain or change in urine volume.  Musculoskeletal: Negative for myalgias or other joint complaints.  Skin: Negative for other color change and wound or drainage.  Neurological: Negative for syncope and numbness. other than noted Hematological: Negative for adenopathy. or other swelling Psychiatric/Behavioral: Negative for hallucinations, SI, self-injury, decreased concentration or other worsening agitation.  All other system neg per pt    Objective:   Physical Exam BP 120/74   Pulse 60   Resp 20   Wt 127 lb (57.6 kg)   SpO2 96%   BMI 20.50 kg/m  VS noted,  Constitutional: Pt is oriented to person, place, and  time. Appears well-developed and well-nourished, in no significant distress Head: Normocephalic and atraumatic  Eyes: Conjunctivae and EOM are normal. Pupils are equal, round, and reactive to light Right Ear: External ear normal.  Left Ear: External ear normal Nose: Nose normal.    Mouth/Throat: Oropharynx is clear and moist  Neck: Normal range of motion. Neck supple. No JVD present. No tracheal deviation present or significant neck LA or mass Cardiovascular: Normal rate, regular rhythm, normal heart sounds and intact distal pulses.   Pulmonary/Chest: Effort normal and breath sounds without rales or wheezing  Abdominal: Soft. Bowel sounds are normal. NT. No HSM  Musculoskeletal: Normal range of motion. Exhibits no edema Lymphadenopathy: Has no cervical adenopathy.  Neurological: Pt is alert and oriented to person, place, and time. Pt has normal reflexes. No cranial nerve deficit. Motor grossly intact Skin: Skin is warm and dry. No rash noted or new ulcers Psychiatric:  Has normal mood and affect. Behavior is normal.  No other new exam findings  ECG today I have personally interpreted: Sinus  Rhythm  Low voltage in precordial leads.   -RSR(V1) -nondiagnostic.      Assessment & Plan:

## 2016-06-19 NOTE — Progress Notes (Signed)
Pre visit review using our clinic review tool, if applicable. No additional management support is needed unless otherwise documented below in the visit note. 

## 2016-06-19 NOTE — Patient Instructions (Signed)
Please continue all other medications as before  Please call if you would like to pursue the cardiac event monitor and/or echocardiogram  Please have the pharmacy call with any other refills you may need.  Please continue your efforts at being more active, low cholesterol diet, and weight control.  You are otherwise up to date with prevention measures today.  Please keep your appointments with your specialists as you may have planned  Please go to the LAB in the Basement (turn left off the elevator) for the tests to be done today  You will be contacted by phone if any changes need to be made immediately.  Otherwise, you will receive a letter about your results with an explanation, but please check with MyChart first.  Please remember to sign up for MyChart if you have not done so, as this will be important to you in the future with finding out test results, communicating by private email, and scheduling acute appointments online when needed.  Please return in 1 year for your yearly visit, or as needed

## 2016-06-20 NOTE — Assessment & Plan Note (Signed)
ecg revewed with pt, suspect pac or pvc by hx, for labs today, pt declines cardiac event monitor, echo , cxr for now,  to f/u any worsening symptoms or concerns

## 2016-06-20 NOTE — Assessment & Plan Note (Signed)

## 2016-07-16 ENCOUNTER — Encounter: Payer: Self-pay | Admitting: Gynecology

## 2016-07-16 ENCOUNTER — Ambulatory Visit (INDEPENDENT_AMBULATORY_CARE_PROVIDER_SITE_OTHER): Payer: BLUE CROSS/BLUE SHIELD | Admitting: Gynecology

## 2016-07-16 VITALS — BP 114/70 | Ht 65.5 in | Wt 125.0 lb

## 2016-07-16 DIAGNOSIS — Z30431 Encounter for routine checking of intrauterine contraceptive device: Secondary | ICD-10-CM

## 2016-07-16 DIAGNOSIS — Z01419 Encounter for gynecological examination (general) (routine) without abnormal findings: Secondary | ICD-10-CM | POA: Diagnosis not present

## 2016-07-16 LAB — COMPREHENSIVE METABOLIC PANEL
ALBUMIN: 4.2 g/dL (ref 3.6–5.1)
ALK PHOS: 27 U/L — AB (ref 33–115)
ALT: 10 U/L (ref 6–29)
AST: 17 U/L (ref 10–30)
BUN: 15 mg/dL (ref 7–25)
CHLORIDE: 104 mmol/L (ref 98–110)
CO2: 23 mmol/L (ref 20–31)
CREATININE: 0.88 mg/dL (ref 0.50–1.10)
Calcium: 9.3 mg/dL (ref 8.6–10.2)
Glucose, Bld: 77 mg/dL (ref 65–99)
Potassium: 4.1 mmol/L (ref 3.5–5.3)
SODIUM: 139 mmol/L (ref 135–146)
TOTAL PROTEIN: 6.7 g/dL (ref 6.1–8.1)
Total Bilirubin: 0.5 mg/dL (ref 0.2–1.2)

## 2016-07-16 LAB — CBC WITH DIFFERENTIAL/PLATELET
BASOS ABS: 0 {cells}/uL (ref 0–200)
BASOS PCT: 0 %
EOS ABS: 86 {cells}/uL (ref 15–500)
EOS PCT: 1 %
HCT: 37.4 % (ref 35.0–45.0)
HEMOGLOBIN: 12.3 g/dL (ref 11.7–15.5)
LYMPHS ABS: 2838 {cells}/uL (ref 850–3900)
Lymphocytes Relative: 33 %
MCH: 31 pg (ref 27.0–33.0)
MCHC: 32.9 g/dL (ref 32.0–36.0)
MCV: 94.2 fL (ref 80.0–100.0)
MPV: 11.4 fL (ref 7.5–12.5)
Monocytes Absolute: 430 cells/uL (ref 200–950)
Monocytes Relative: 5 %
NEUTROS ABS: 5246 {cells}/uL (ref 1500–7800)
Neutrophils Relative %: 61 %
Platelets: 223 10*3/uL (ref 140–400)
RBC: 3.97 MIL/uL (ref 3.80–5.10)
RDW: 13.4 % (ref 11.0–15.0)
WBC: 8.6 10*3/uL (ref 3.8–10.8)

## 2016-07-16 NOTE — Progress Notes (Signed)
    Anibal HendersonShannon B Zullo 05-19-1976 [id number]        41 y.o.  G3P3 for annual exam.    Past medical history,surgical history, problem list, medications, allergies, family history and social history were all reviewed and documented as reviewed in the EPIC chart.  ROS:  Performed with pertinent positives and negatives included in the history, assessment and plan.   Additional significant findings :  None   Exam: Kennon PortelaKim Gardner assistant Vitals:   07/16/16 1359  BP: 114/70  Weight: 125 lb (56.7 kg)  Height: 5' 5.5" (1.664 m)   Body mass index is 20.48 kg/m.  General appearance:  Normal affect, orientation and appearance. Skin: Grossly normal HEENT: Without gross lesions.  No cervical or supraclavicular adenopathy. Thyroid normal.  Lungs:  Clear without wheezing, rales or rhonchi Cardiac: RR, without RMG Abdominal:  Soft, nontender, without masses, guarding, rebound, organomegaly or hernia Breasts:  Examined lying and sitting without masses, retractions, discharge or axillary adenopathy. Pelvic:  Ext, BUS, Vagina: Normal  Cervix: Normal. IUD string visualized. IUD strings trimmed to within the external cervical os  Uterus: Anteverted, normal size, shape and contour, midline and mobile nontender   Adnexa: Without masses or tenderness    Anus and perineum: Normal   Rectovaginal: Normal sphincter tone without palpated masses or tenderness.    Assessment/Plan:  41 y.o. G3P3 female for annual exam without menses, Mirena IUD.   1. Mirena IUD 10/2014. Doing well without menses. Patient's husband was feeling the strings. They were trimmed to within the external os. 2. Mammography 11/2015. Continue with annual mammography when due. SBE monthly reviewed. 3. Pap smear/HPV 2016. No Pap smear done today. No history of significant abnormal Pap smears. 4. Health maintenance. Baseline CBC, CMP and urinalysis done today. Lipid profile 2 years ago excellent. Follow up 1 year, sooner as  needed.   Dara LordsFONTAINE,Johnasia Liese P MD, 2:26 PM 07/16/2016

## 2016-07-16 NOTE — Patient Instructions (Signed)

## 2016-07-17 LAB — URINALYSIS W MICROSCOPIC + REFLEX CULTURE
Bilirubin Urine: NEGATIVE
CASTS: NONE SEEN [LPF]
Glucose, UA: NEGATIVE
HGB URINE DIPSTICK: NEGATIVE
Ketones, ur: NEGATIVE
Leukocytes, UA: NEGATIVE
NITRITE: NEGATIVE
PROTEIN: NEGATIVE
RBC / HPF: NONE SEEN RBC/HPF (ref <=2)
Specific Gravity, Urine: 1.024 (ref 1.001–1.035)
YEAST: NONE SEEN [HPF]
pH: 5.5 (ref 5.0–8.0)

## 2016-07-18 LAB — URINE CULTURE

## 2016-11-08 ENCOUNTER — Ambulatory Visit (HOSPITAL_COMMUNITY)
Admission: EM | Admit: 2016-11-08 | Discharge: 2016-11-08 | Disposition: A | Discharge status: Home / Self Care | Payer: BLUE CROSS/BLUE SHIELD | Attending: Internal Medicine | Admitting: Internal Medicine

## 2016-11-08 ENCOUNTER — Encounter (HOSPITAL_COMMUNITY): Payer: Self-pay | Admitting: Emergency Medicine

## 2016-11-08 ENCOUNTER — Ambulatory Visit (INDEPENDENT_AMBULATORY_CARE_PROVIDER_SITE_OTHER): Payer: BLUE CROSS/BLUE SHIELD

## 2016-11-08 DIAGNOSIS — K2100 Gastro-esophageal reflux disease with esophagitis, without bleeding: Secondary | ICD-10-CM

## 2016-11-08 DIAGNOSIS — Z3202 Encounter for pregnancy test, result negative: Secondary | ICD-10-CM

## 2016-11-08 DIAGNOSIS — R109 Unspecified abdominal pain: Secondary | ICD-10-CM | POA: Diagnosis not present

## 2016-11-08 DIAGNOSIS — K5900 Constipation, unspecified: Secondary | ICD-10-CM

## 2016-11-08 LAB — POCT URINALYSIS DIP (DEVICE)
Bilirubin Urine: NEGATIVE
Glucose, UA: NEGATIVE mg/dL
HGB URINE DIPSTICK: NEGATIVE
Ketones, ur: NEGATIVE mg/dL
Leukocytes, UA: NEGATIVE
NITRITE: NEGATIVE
PH: 7 (ref 5.0–8.0)
PROTEIN: NEGATIVE mg/dL
Specific Gravity, Urine: 1.015 (ref 1.005–1.030)
UROBILINOGEN UA: 0.2 mg/dL (ref 0.0–1.0)

## 2016-11-08 LAB — POCT PREGNANCY, URINE: Preg Test, Ur: NEGATIVE

## 2016-11-08 MED ORDER — GI COCKTAIL ~~LOC~~
ORAL | Status: AC
  Filled 2016-11-08: qty 30

## 2016-11-08 MED ORDER — OMEPRAZOLE 40 MG PO CPDR: 40.0000 mg | DELAYED_RELEASE_CAPSULE | Freq: Every day | ORAL | 0 refills | Status: DC

## 2016-11-08 MED ORDER — GI COCKTAIL ~~LOC~~
30.0000 mL | Freq: Once | ORAL | Status: AC
  Administered 2016-11-08: 30 mL via ORAL

## 2016-11-08 NOTE — ED Triage Notes (Signed)
[facility] a [facility] of abdominal cramping that she stated she can not pin point a  Location of origin. The patient denied any N/V/D

## 2016-11-08 NOTE — ED Provider Notes (Signed)
CSN: [id number]     Arrival date & time 11/08/16  1808 History   None    Chief Complaint  Patient presents with  . Abdominal Pain   (Consider location/radiation/quality/duration/timing/severity/associated sxs/prior Treatment) Patient c/o abdominal cramping and epigastric pain.  States her pain is from her lower abdomen up to upper abdomen.   The history is provided by the patient.  Abdominal Pain  Pain location:  Generalized Pain radiates to:  Epigastric region Pain severity:  Mild Onset quality:  Sudden Duration:  1 day Timing:  Constant Progression:  Worsening Chronicity:  New Relieved by:  Nothing Worsened by:  Nothing Ineffective treatments:  None tried   Past Medical History:  Diagnosis Date  . Rosacea    Past Surgical History:  Procedure Laterality Date  . INTRAUTERINE DEVICE INSERTION  11/19/2014   MIRENA   Family History  Problem Relation Age of Onset  . Hypertension Mother   . Rheum arthritis Maternal Grandmother    Social History  Substance Use Topics  . Smoking status: Former Games developermoker  . Smokeless tobacco: Never Used     Comment: Quit in 2005  . Alcohol use 0.0 oz/week     Comment: social   OB History    Gravida Para Term Preterm AB Living   3 3       3    SAB TAB Ectopic Multiple Live Births                 Review of Systems  Constitutional: Negative.   HENT: Negative.   Eyes: Negative.   Respiratory: Negative.   Cardiovascular: Negative.   Gastrointestinal: Positive for abdominal pain.  Endocrine: Negative.   Genitourinary: Negative.   Musculoskeletal: Negative.   Allergic/Immunologic: Negative.   Neurological: Negative.   Hematological: Negative.   Psychiatric/Behavioral: Negative.     Allergies  Paroxetine; Sulfonamide derivatives; and Penicillins  Home Medications   Prior to Admission medications   Medication Sig Start Date End Date Taking? Authorizing Provider  Cholecalciferol (VITAMIN D PO) Take by mouth.    [provider]  omeprazole (PRILOSEC) 40 MG capsule Take 1 capsule (40 mg total) by mouth daily. 11/08/16   Deatra Canterxford, William J, FNP   Meds Ordered and Administered this Visit   Medications  gi cocktail (Maalox,Lidocaine,Donnatal) (30 mLs Oral Given 11/08/16 1906)    BP 137/80 (BP Location: Left Arm)   Pulse 71   Temp 98.8 F (37.1 C) (Oral)   Resp 18   SpO2 100%  No data found.   Physical Exam  Constitutional: She is oriented to person, place, and time. She appears well-developed and well-nourished.  HENT:  Head: Normocephalic and atraumatic.  Eyes: Conjunctivae and EOM are normal. Pupils are equal, round, and reactive to light.  Neck: Normal range of motion. Neck supple.  Cardiovascular: Normal rate, regular rhythm and normal heart sounds.   Pulmonary/Chest: Effort normal and breath sounds normal.  Abdominal: Soft.  Bowel sounds hyperactive Tenderness epigastric and lower abdomen.  No guarding or rebound.  Musculoskeletal: Normal range of motion.  Neurological: She is alert and oriented to person, place, and time.  Nursing note and vitals reviewed.   Urgent Care Course     Procedures (including critical care time)  Labs Review Labs Reviewed  POCT URINALYSIS DIP (DEVICE)  POCT PREGNANCY, URINE    Imaging Review Dg Abd 1 View  Result Date: 11/08/2016 CLINICAL DATA:  Abdominal pain EXAM: ABDOMEN - 1 VIEW COMPARISON:  None. FINDINGS: No dilated small bowel  loops. Moderate gas in stool throughout the large bowel. No evidence of pneumatosis or pneumoperitoneum. IUD overlies the midline deep pelvis. No radiopaque urolithiasis. Clear lung bases. IMPRESSION: Nonobstructive bowel gas pattern. Moderate colonic stool volume may indicate constipation. Electronically Signed   By: Delbert Phenix M.D.   On: 11/08/2016 19:50     Visual Acuity Review  Right Eye Distance:   Left Eye Distance:   Bilateral Distance:    Right Eye Near:   Left Eye Near:    Bilateral Near:          MDM   1. Gastroesophageal reflux disease with esophagitis   2. Constipation, unspecified constipation type    GI cocktail with good results Prilosec 40mg  one po qd #14 Miralax one packet qd #14      Deatra Canter, FNP 11/08/16 2003

## 2017-09-09 ENCOUNTER — Ambulatory Visit (INDEPENDENT_AMBULATORY_CARE_PROVIDER_SITE_OTHER): Payer: BLUE CROSS/BLUE SHIELD | Admitting: Gynecology

## 2017-09-09 ENCOUNTER — Encounter: Payer: Self-pay | Admitting: Gynecology

## 2017-09-09 VITALS — BP 120/76 | Ht 65.0 in | Wt 128.0 lb

## 2017-09-09 DIAGNOSIS — Z13220 Encounter for screening for lipoid disorders: Secondary | ICD-10-CM

## 2017-09-09 DIAGNOSIS — Z01411 Encounter for gynecological examination (general) (routine) with abnormal findings: Secondary | ICD-10-CM

## 2017-09-09 DIAGNOSIS — Z30431 Encounter for routine checking of intrauterine contraceptive device: Secondary | ICD-10-CM

## 2017-09-09 LAB — COMPREHENSIVE METABOLIC PANEL
AG RATIO: 2 (calc) (ref 1.0–2.5)
ALBUMIN MSPROF: 4.6 g/dL (ref 3.6–5.1)
ALT: 9 U/L (ref 6–29)
AST: 16 U/L (ref 10–30)
Alkaline phosphatase (APISO): 33 U/L (ref 33–115)
BILIRUBIN TOTAL: 0.7 mg/dL (ref 0.2–1.2)
BUN: 14 mg/dL (ref 7–25)
CALCIUM: 9.6 mg/dL (ref 8.6–10.2)
CO2: 27 mmol/L (ref 20–32)
Chloride: 103 mmol/L (ref 98–110)
Creat: 0.85 mg/dL (ref 0.50–1.10)
GLUCOSE: 78 mg/dL (ref 65–99)
Globulin: 2.3 g/dL (calc) (ref 1.9–3.7)
POTASSIUM: 4.1 mmol/L (ref 3.5–5.3)
SODIUM: 138 mmol/L (ref 135–146)
TOTAL PROTEIN: 6.9 g/dL (ref 6.1–8.1)

## 2017-09-09 LAB — LIPID PANEL
Cholesterol: 180 mg/dL (ref <200)
HDL: 78 mg/dL (ref >50)
LDL CHOLESTEROL (CALC): 89 mg/dL
Non-HDL Cholesterol (Calc): 102 mg/dL (calc) (ref <130)
TRIGLYCERIDES: 52 mg/dL (ref <150)
Total CHOL/HDL Ratio: 2.3 (calc) (ref <5.0)

## 2017-09-09 LAB — CBC WITH DIFFERENTIAL/PLATELET
Basophils Absolute: 41 cells/uL (ref 0–200)
Basophils Relative: 0.6 %
EOS PCT: 0.6 %
Eosinophils Absolute: 41 cells/uL (ref 15–500)
HEMATOCRIT: 38.9 % (ref 35.0–45.0)
HEMOGLOBIN: 13.1 g/dL (ref 11.7–15.5)
LYMPHS ABS: 2502 {cells}/uL (ref 850–3900)
MCH: 31.6 pg (ref 27.0–33.0)
MCHC: 33.7 g/dL (ref 32.0–36.0)
MCV: 93.7 fL (ref 80.0–100.0)
MONOS PCT: 7 %
MPV: 12 fL (ref 7.5–12.5)
NEUTROS ABS: 3740 {cells}/uL (ref 1500–7800)
Neutrophils Relative %: 55 %
Platelets: 241 10*3/uL (ref 140–400)
RBC: 4.15 10*6/uL (ref 3.80–5.10)
RDW: 11.9 % (ref 11.0–15.0)
Total Lymphocyte: 36.8 %
WBC mixed population: 476 cells/uL (ref 200–950)
WBC: 6.8 10*3/uL (ref 3.8–10.8)

## 2017-09-09 NOTE — Progress Notes (Signed)
    Mary HendersonShannon B Krahl 12-17-1975 [id number]        42 y.o.  G3P3 for annual gynecologic exam.  Doing well without complaints.  Past medical history,surgical history, problem list, medications, allergies, family history and social history were all reviewed and documented as reviewed in the EPIC chart.  ROS:  Performed with pertinent positives and negatives included in the history, assessment and plan.   Additional significant findings : None   Exam: Kennon PortelaKim Gardner assistant Vitals:   09/09/17 1208  BP: 120/76  Weight: 128 lb (58.1 kg)  Height: 5\' 5"  (1.651 m)   Body mass index is 21.3 kg/m.  General appearance:  Normal affect, orientation and appearance. Skin: Grossly normal HEENT: Without gross lesions.  No cervical or supraclavicular adenopathy. Thyroid normal.  Lungs:  Clear without wheezing, rales or rhonchi Cardiac: RR, without RMG Abdominal:  Soft, nontender, without masses, guarding, rebound, organomegaly or hernia Breasts:  Examined lying and sitting without masses, retractions, discharge or axillary adenopathy. Pelvic:  Ext, BUS, Vagina: Normal  Cervix: Normal.  IUD string palpated  Uterus: Anteverted, normal size, shape and contour, midline and mobile nontender   Adnexa: Without masses or tenderness    Anus and perineum: Normal   Rectovaginal: Normal sphincter tone without palpated masses or tenderness.    Assessment/Plan:  42 y.o. G3P3 female for annual gynecologic exam without menses, Mirena IUD.   1. Mirena IUD 10/2014.  Doing well without menses.  IUD strings palpated at external loss 2. Mammography 2017.  Recommended screening mammogram now.  Breast exam normal today. 3. Pap smear/HPV 2016.  No Pap smear done today.  No history of abnormal Pap smears.  Plan repeat Pap smear at 5-year interval per current screening guidelines. 4. Health maintenance.  Patient requests baseline labs.  CBC, CMP and lipid profile ordered.  Follow-up 1 year, sooner as  needed.   Dara Lordsimothy P Manika Hast MD, 12:36 PM 09/09/2017

## 2017-09-09 NOTE — Patient Instructions (Signed)
Call to Schedule your mammogram  Facilities in [place]: 1)  [facility]. [facility], [address] Phone: [phone] 2)  Dr. Bertrand at [facility]  [address] Phone: [phone]     Mammogram A mammogram is an X-ray test to find changes in a woman's breast. You should get a mammogram if:  You are 43 years of age or older  You have risk factors.   Your doctor recommends that you have one.  BEFORE THE TEST  Do not schedule the test the week before your period, especially if your breasts are sore during this time.  On the day of your mammogram:  Wash your breasts and armpits well. After washing, do not put on any deodorant or talcum powder on until after your test.   Eat and drink as you usually do.   Take your medicines as usual.   If you are diabetic and take insulin, make sure you:   Eat before coming for your test.   Take your insulin as usual.   If you cannot keep your appointment, call before the appointment to cancel. Schedule another appointment.  TEST  You will need to undress from the waist up. You will put on a hospital gown.   Your breast will be put on the mammogram machine, and it will press firmly on your breast with a piece of plastic called a compression paddle. This will make your breast flatter so that the machine can X-ray all parts of your breast.   Both breasts will be X-rayed. Each breast will be X-rayed from above and from the side. An X-ray might need to be taken again if the picture is not good enough.   The mammogram will last about 15 to 30 minutes.  AFTER THE TEST Finding out the results of your test Ask when your test results will be ready. Make sure you get your test results.  Document Released: 08/07/2008 Document Revised: 04/30/2011 Document Reviewed: 08/07/2008 ExitCare Patient Information 2012 [employer].   

## 2017-10-14 DIAGNOSIS — Z1231 Encounter for screening mammogram for malignant neoplasm of breast: Secondary | ICD-10-CM | POA: Diagnosis not present

## 2017-10-19 ENCOUNTER — Encounter: Payer: Self-pay | Admitting: Gynecology

## 2017-10-21 DIAGNOSIS — R922 Inconclusive mammogram: Secondary | ICD-10-CM | POA: Diagnosis not present

## 2017-10-21 DIAGNOSIS — N6011 Diffuse cystic mastopathy of right breast: Secondary | ICD-10-CM | POA: Diagnosis not present

## 2017-10-21 DIAGNOSIS — N6002 Solitary cyst of left breast: Secondary | ICD-10-CM | POA: Diagnosis not present

## 2017-10-22 ENCOUNTER — Encounter: Payer: Self-pay | Admitting: Gynecology

## 2017-10-29 ENCOUNTER — Encounter: Payer: Self-pay | Admitting: Gynecology

## 2017-10-29 ENCOUNTER — Ambulatory Visit (INDEPENDENT_AMBULATORY_CARE_PROVIDER_SITE_OTHER): Payer: BLUE CROSS/BLUE SHIELD | Admitting: Gynecology

## 2017-10-29 VITALS — BP 116/76

## 2017-10-29 DIAGNOSIS — R928 Other abnormal and inconclusive findings on diagnostic imaging of breast: Secondary | ICD-10-CM

## 2017-10-29 NOTE — Patient Instructions (Signed)
Follow-up for the recommended 6739-month mammogram

## 2017-10-29 NOTE — Progress Notes (Signed)
    Anibal HendersonShannon B Morales 07/04/1975 [id number]        42 y.o.  G3P3 presents with questions about her recent mammogram.  She went for a screening mammogram and ultimately had additional views with ultrasound.  They recommended a six-month follow-up ultrasound.  Their final impression is:  1. 2 mm cysts right breast upper outer quadrant middle depth are benign 2. 2.  1.3 cm complex cyst in the left breast lower outer quadrant anterior depth is probably benign 3. 3.  6 mm complex cyst in the left breast at 2:00 anterior depth is probably benign.  Past medical history,surgical history, problem list, medications, allergies, family history and social history were all reviewed and documented in the EPIC chart.  Directed ROS with pertinent positives and negatives documented in the history of present illness/assessment and plan.  Exam: Kennon PortelaKim Gardner assistant Vitals:   10/29/17 1330  BP: 116/76   General appearance:  Normal Both breasts examined lying and sitting.  Left without masses, retractions, discharge, adenopathy.  Right with small BB size subcutaneous nodule upper outer quadrant.  No other masses, retractions, discharge, adenopathy.  Assessment/Plan:  42 y.o. G3P3 with abnormal mammogram with readings as above.  Exam today is normal with the exception of a small BB area that was not present previously when I examined her in April and she noted that it just developed.  It is read as benign per ultrasound.  I discussed with the patient the findings of the mammogram which are probably benign recognizing that also means possibly malignant.  There is no overt concerning changes but no guarantees.  Options are to proceed with biopsy now versus follow-up 5821-month studies.  At this point she feels comfortable with the six-month studies excepting the disclaimer.     Dara Lordsimothy P Jeannine Pennisi MD, 1:50 PM 10/29/2017

## 2017-11-24 DIAGNOSIS — Z880 Allergy status to penicillin: Secondary | ICD-10-CM | POA: Diagnosis not present

## 2017-11-24 DIAGNOSIS — M2669 Other specified disorders of temporomandibular joint: Secondary | ICD-10-CM | POA: Diagnosis not present

## 2017-11-24 DIAGNOSIS — R200 Anesthesia of skin: Secondary | ICD-10-CM | POA: Diagnosis not present

## 2017-11-24 DIAGNOSIS — F430 Acute stress reaction: Secondary | ICD-10-CM | POA: Diagnosis not present

## 2017-11-24 DIAGNOSIS — R112 Nausea with vomiting, unspecified: Secondary | ICD-10-CM | POA: Diagnosis not present

## 2017-11-24 DIAGNOSIS — R0602 Shortness of breath: Secondary | ICD-10-CM | POA: Diagnosis not present

## 2017-11-24 DIAGNOSIS — Z881 Allergy status to other antibiotic agents status: Secondary | ICD-10-CM | POA: Diagnosis not present

## 2017-11-24 DIAGNOSIS — F419 Anxiety disorder, unspecified: Secondary | ICD-10-CM | POA: Diagnosis not present

## 2017-11-24 DIAGNOSIS — R197 Diarrhea, unspecified: Secondary | ICD-10-CM | POA: Diagnosis not present

## 2017-12-14 DIAGNOSIS — F4325 Adjustment disorder with mixed disturbance of emotions and conduct: Secondary | ICD-10-CM | POA: Diagnosis not present

## 2017-12-22 DIAGNOSIS — F4325 Adjustment disorder with mixed disturbance of emotions and conduct: Secondary | ICD-10-CM | POA: Diagnosis not present

## 2018-01-13 DIAGNOSIS — M19072 Primary osteoarthritis, left ankle and foot: Secondary | ICD-10-CM | POA: Diagnosis not present

## 2018-01-13 DIAGNOSIS — G5762 Lesion of plantar nerve, left lower limb: Secondary | ICD-10-CM | POA: Diagnosis not present

## 2018-01-20 DIAGNOSIS — M7752 Other enthesopathy of left foot: Secondary | ICD-10-CM | POA: Diagnosis not present

## 2018-01-20 DIAGNOSIS — G5762 Lesion of plantar nerve, left lower limb: Secondary | ICD-10-CM | POA: Diagnosis not present

## 2018-04-26 DIAGNOSIS — N6002 Solitary cyst of left breast: Secondary | ICD-10-CM | POA: Diagnosis not present

## 2018-04-27 ENCOUNTER — Encounter: Payer: Self-pay | Admitting: Gynecology

## 2018-07-15 DIAGNOSIS — J209 Acute bronchitis, unspecified: Secondary | ICD-10-CM | POA: Diagnosis not present

## 2018-07-27 DIAGNOSIS — J45909 Unspecified asthma, uncomplicated: Secondary | ICD-10-CM | POA: Diagnosis not present

## 2018-07-27 DIAGNOSIS — J0100 Acute maxillary sinusitis, unspecified: Secondary | ICD-10-CM | POA: Diagnosis not present

## 2018-07-27 DIAGNOSIS — M26609 Unspecified temporomandibular joint disorder, unspecified side: Secondary | ICD-10-CM | POA: Diagnosis not present

## 2018-08-08 DIAGNOSIS — R509 Fever, unspecified: Secondary | ICD-10-CM | POA: Diagnosis not present

## 2018-11-12 IMAGING — DX DG ABDOMEN 1V
2 series · 2 of 2 positions shown · non-contrast
Comparison: None.

CLINICAL DATA: Abdominal pain

EXAM:
ABDOMEN - 1 VIEW

[abdomen kub (1 of 2)]
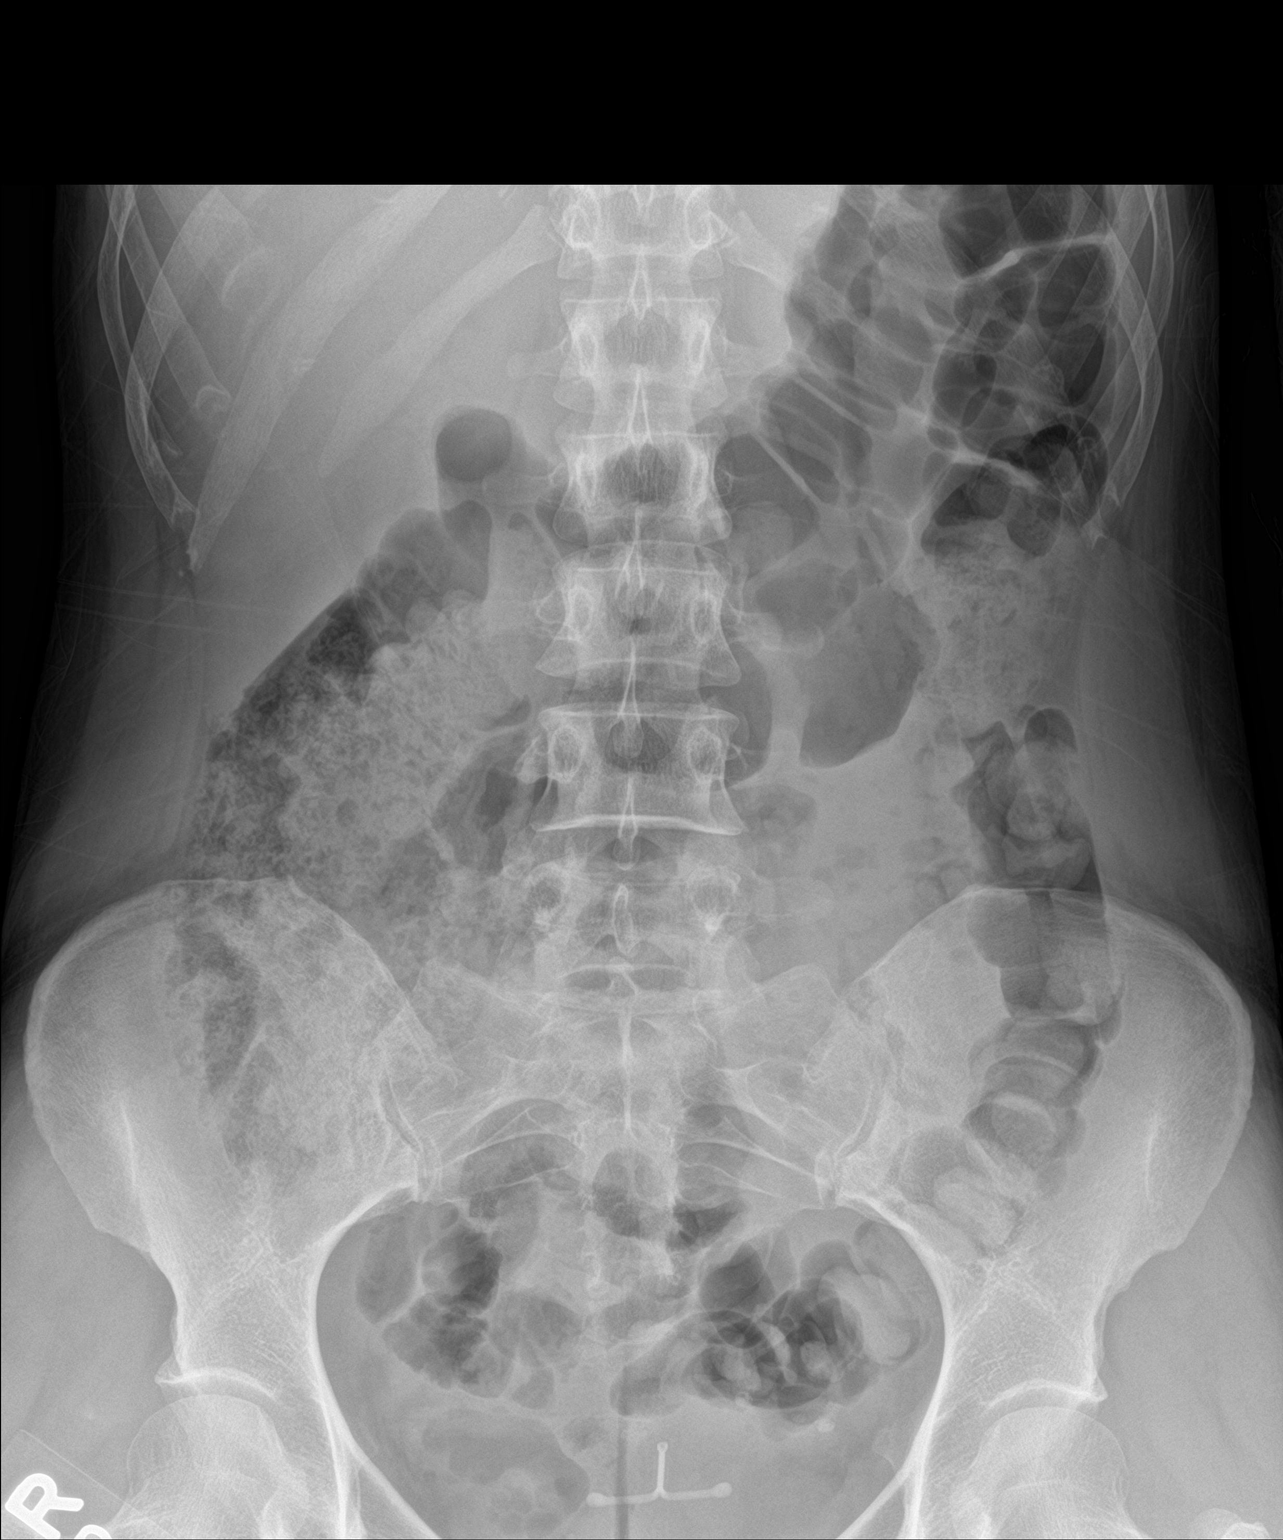

[abdomen kub (2 of 2)]
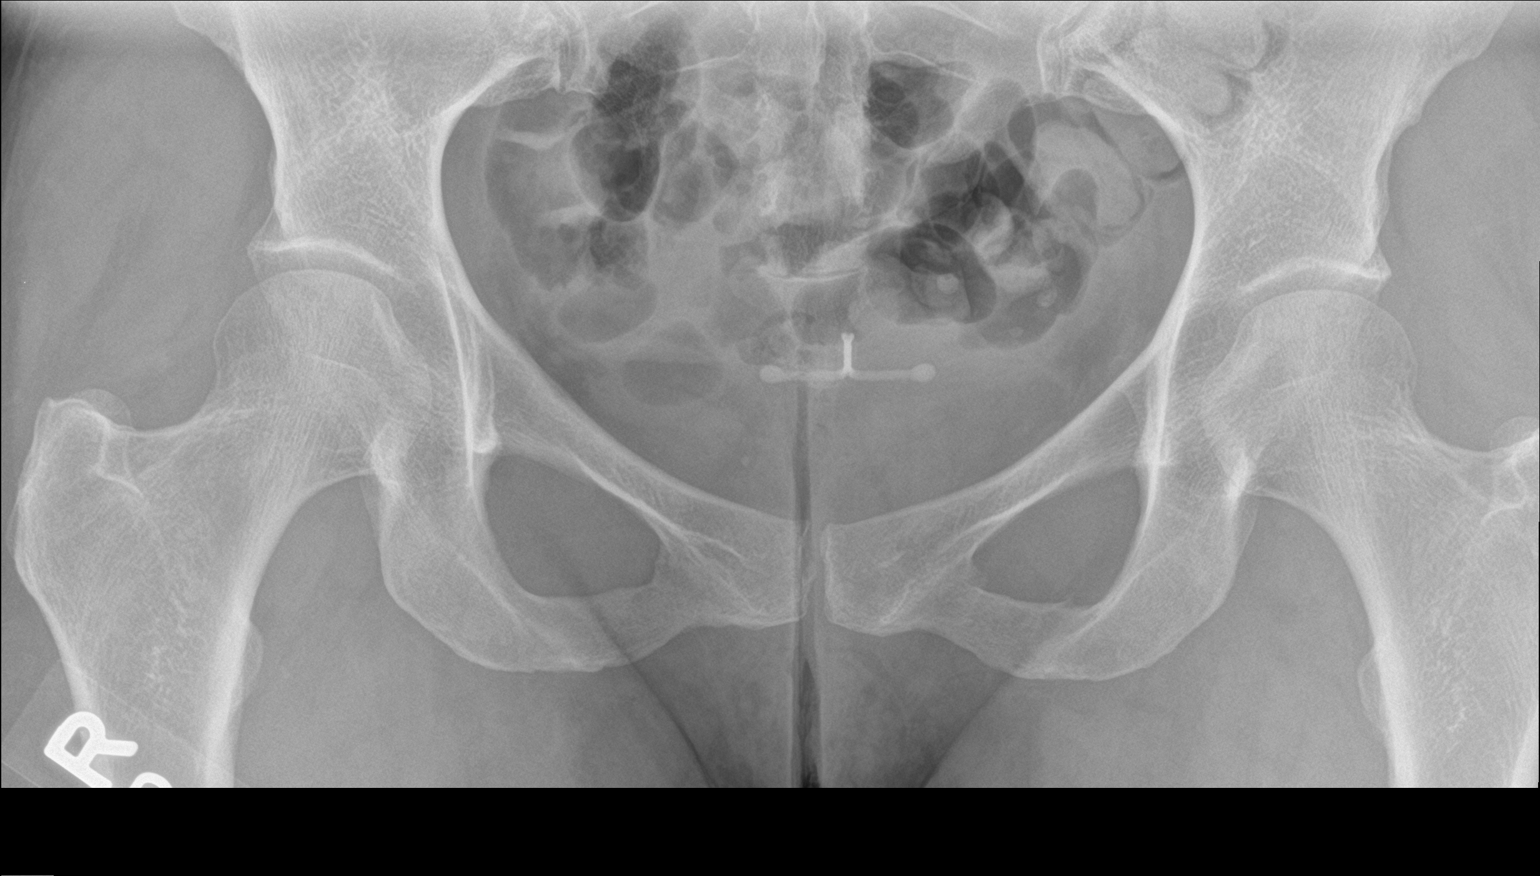

[2 of 2 positions shown; findings below may reference images not displayed]

FINDINGS: No dilated small bowel loops. Moderate gas in stool throughout the
large bowel. No evidence of pneumatosis or pneumoperitoneum. IUD
overlies the midline deep pelvis. No radiopaque urolithiasis. Clear
lung bases.
IMPRESSION: Nonobstructive bowel gas pattern. Moderate colonic stool volume may
indicate constipation.

## 2019-02-14 ENCOUNTER — Encounter: Payer: Self-pay | Admitting: Gynecology

## 2019-02-23 DIAGNOSIS — R87610 Atypical squamous cells of undetermined significance on cytologic smear of cervix (ASC-US): Secondary | ICD-10-CM

## 2019-02-23 HISTORY — DX: Atypical squamous cells of undetermined significance on cytologic smear of cervix (ASC-US): R87.610

## 2019-03-16 ENCOUNTER — Other Ambulatory Visit: Payer: Self-pay

## 2019-03-16 ENCOUNTER — Encounter: Payer: Self-pay | Admitting: Gynecology

## 2019-03-16 ENCOUNTER — Ambulatory Visit (INDEPENDENT_AMBULATORY_CARE_PROVIDER_SITE_OTHER): Payer: BC Managed Care – PPO | Admitting: Gynecology

## 2019-03-16 VITALS — BP 110/78 | Ht 65.0 in | Wt 127.0 lb

## 2019-03-16 DIAGNOSIS — Z01419 Encounter for gynecological examination (general) (routine) without abnormal findings: Secondary | ICD-10-CM | POA: Diagnosis not present

## 2019-03-16 DIAGNOSIS — Z13220 Encounter for screening for lipoid disorders: Secondary | ICD-10-CM | POA: Diagnosis not present

## 2019-03-16 DIAGNOSIS — Z30431 Encounter for routine checking of intrauterine contraceptive device: Secondary | ICD-10-CM | POA: Diagnosis not present

## 2019-03-16 DIAGNOSIS — R87610 Atypical squamous cells of undetermined significance on cytologic smear of cervix (ASC-US): Secondary | ICD-10-CM | POA: Diagnosis not present

## 2019-03-16 NOTE — Progress Notes (Signed)
    BATINA DOUGAN 11/16/1975 [id number]        43 y.o.  G3P3 for annual gynecologic exam.  Several issues noted below  Past medical history,surgical history, problem list, medications, allergies, family history and social history were all reviewed and documented as reviewed in the EPIC chart.  ROS:  Performed with pertinent positives and negatives included in the history, assessment and plan.   Additional significant findings : None   Exam: Wandra Scot assistant Vitals:   03/16/19 1103  BP: 110/78  Weight: 127 lb (57.6 kg)  Height: 5\' 5"  (1.651 m)   Body mass index is 21.13 kg/m.  General appearance:  Normal affect, orientation and appearance. Skin: Grossly normal HEENT: Without gross lesions.  No cervical or supraclavicular adenopathy. Thyroid normal.  Lungs:  Clear without wheezing, rales or rhonchi Cardiac: RR, without RMG Abdominal:  Soft, nontender, without masses, guarding, rebound, organomegaly or hernia Breasts:  Examined lying and sitting without masses, retractions, discharge or axillary adenopathy. Pelvic:  Ext, BUS, Vagina: Normal  Cervix: Normal.  IUD string visualized.  Pap smear/HPV  Uterus: Anteverted, normal size, shape and contour, midline and mobile nontender   Adnexa: Without masses or tenderness    Anus and perineum: Normal   Rectovaginal: Normal sphincter tone without palpated masses or tenderness.    Assessment/Plan:  43 y.o. G3P3 female for annual gynecologic exam.  Without menses, Mirena IUD  1. Mirena IUD 10/2014.  Doing well without menses.  I reminded her that she is due to have it replaced 10/2019 and she will follow-up at that time. 2. History of several cysts on ultrasound last year.  She had a 38-month follow-up which was stable and they recommended annual mammography.  She is overdue and she is going to call today to schedule this.  Breast exam is normal today with no palpable abnormalities. 3. Pap smear/HPV 2016.  Pap smear/HPV today.  No  history of abnormal Pap smears previously. 4. Health maintenance.  Requests baseline labs.  CBC, CMP and lipid profile ordered.  Follow-up 1 year, sooner as needed.   Anastasio Auerbach MD, 11:37 AM 03/16/2019

## 2019-03-16 NOTE — Patient Instructions (Signed)
Schedule your mammogram at [facility]  Follow-up in June 2021 for IUD replacement  Follow-up in 1 year for annual exam

## 2019-03-16 NOTE — Progress Notes (Signed)
LAB

## 2019-03-17 LAB — PAP, TP IMAGING W/ HPV RNA, RFLX HPV TYPE 16,18/45: HPV DNA High Risk: NOT DETECTED

## 2019-03-17 LAB — CBC WITH DIFFERENTIAL/PLATELET
Absolute Monocytes: 499 cells/uL (ref 200–950)
Basophils Absolute: 38 cells/uL (ref 0–200)
Basophils Relative: 0.6 %
Eosinophils Absolute: 38 cells/uL (ref 15–500)
Eosinophils Relative: 0.6 %
HCT: 37.9 % (ref 35.0–45.0)
Hemoglobin: 12.4 g/dL (ref 11.7–15.5)
Lymphs Abs: 2637 cells/uL (ref 850–3900)
MCH: 32 pg (ref 27.0–33.0)
MCHC: 32.7 g/dL (ref 32.0–36.0)
MCV: 97.7 fL (ref 80.0–100.0)
MPV: 12.3 fL (ref 7.5–12.5)
Monocytes Relative: 7.8 %
Neutro Abs: 3187 cells/uL (ref 1500–7800)
Neutrophils Relative %: 49.8 %
Platelets: 224 10*3/uL (ref 140–400)
RBC: 3.88 10*6/uL (ref 3.80–5.10)
RDW: 11.8 % (ref 11.0–15.0)
Total Lymphocyte: 41.2 %
WBC: 6.4 10*3/uL (ref 3.8–10.8)

## 2019-03-17 LAB — COMPREHENSIVE METABOLIC PANEL
AG Ratio: 1.7 (calc) (ref 1.0–2.5)
ALT: 11 U/L (ref 6–29)
AST: 16 U/L (ref 10–30)
Albumin: 4.3 g/dL (ref 3.6–5.1)
Alkaline phosphatase (APISO): 25 U/L — ABNORMAL LOW (ref 31–125)
BUN: 19 mg/dL (ref 7–25)
CO2: 25 mmol/L (ref 20–32)
Calcium: 9.5 mg/dL (ref 8.6–10.2)
Chloride: 105 mmol/L (ref 98–110)
Creat: 0.72 mg/dL (ref 0.50–1.10)
Globulin: 2.6 g/dL (calc) (ref 1.9–3.7)
Glucose, Bld: 88 mg/dL (ref 65–99)
Potassium: 4.1 mmol/L (ref 3.5–5.3)
Sodium: 138 mmol/L (ref 135–146)
Total Bilirubin: 0.6 mg/dL (ref 0.2–1.2)
Total Protein: 6.9 g/dL (ref 6.1–8.1)

## 2019-03-17 LAB — LIPID PANEL
Cholesterol: 174 mg/dL (ref <200)
HDL: 74 mg/dL (ref >=50)
LDL Cholesterol (Calc): 87 mg/dL (calc)
Non-HDL Cholesterol (Calc): 100 mg/dL (calc) (ref <130)
Total CHOL/HDL Ratio: 2.4 (calc) (ref <5.0)
Triglycerides: 51 mg/dL (ref <150)

## 2019-03-20 ENCOUNTER — Encounter: Payer: Self-pay | Admitting: Gynecology

## 2019-03-23 DIAGNOSIS — D2371 Other benign neoplasm of skin of right lower limb, including hip: Secondary | ICD-10-CM | POA: Diagnosis not present

## 2019-03-23 DIAGNOSIS — L723 Sebaceous cyst: Secondary | ICD-10-CM | POA: Diagnosis not present

## 2019-03-23 DIAGNOSIS — D2262 Melanocytic nevi of left upper limb, including shoulder: Secondary | ICD-10-CM | POA: Diagnosis not present

## 2019-03-23 DIAGNOSIS — L821 Other seborrheic keratosis: Secondary | ICD-10-CM | POA: Diagnosis not present

## 2019-04-02 ENCOUNTER — Ambulatory Visit (HOSPITAL_COMMUNITY)
Admission: EM | Admit: 2019-04-02 | Discharge: 2019-04-02 | Disposition: A | Discharge status: Home / Self Care | Payer: BC Managed Care – PPO | Attending: Family Medicine | Admitting: Family Medicine

## 2019-04-02 ENCOUNTER — Other Ambulatory Visit: Payer: Self-pay

## 2019-04-02 ENCOUNTER — Encounter (HOSPITAL_COMMUNITY): Payer: Self-pay | Admitting: Emergency Medicine

## 2019-04-02 DIAGNOSIS — M461 Sacroiliitis, not elsewhere classified: Secondary | ICD-10-CM | POA: Diagnosis present

## 2019-04-02 MED ORDER — PREDNISONE 10 MG PO TABS: 20.0000 mg | ORAL_TABLET | Freq: Every day | ORAL | 0 refills | Status: AC

## 2019-04-02 NOTE — ED Triage Notes (Signed)
Pt reports bending over yesterday and getting a pain in her mid lower back.  Pt states it is a constant sharp pain and gets worse if she tries to bend over.

## 2019-04-02 NOTE — ED Provider Notes (Signed)
[place]    CSN: [id number] Arrival date & time: 04/02/19  1120      History   Chief Complaint Chief Complaint  Patient presents with  . Back Pain    HPI Mary Mcneil is a 43 y.o. female.   Patient was doing Pilates yesterday and felt pain in her right low back.  Nonradiating pain.  No prior history of back problems.  HPI  Past Medical History:  Diagnosis Date  . ASCUS of cervix with negative high risk HPV 02/2019  . Rosacea     Patient Active Problem List   Diagnosis Date Noted  . Sacroiliitis ([place]) 04/02/2019  . Palpitations 06/19/2016  . Wheezing 03/12/2012  . Acute bronchitis 03/11/2012  . IUD   . Preventative health care 11/06/2010  . Bilateral carpal tunnel syndrome 11/06/2010  . Bilateral knee pain 11/06/2010  . SINUSITIS, CHRONIC 07/21/2010  . ANXIETY 05/30/2009  . MIGRAINE, COMMON 05/30/2009    Past Surgical History:  Procedure Laterality Date  . INTRAUTERINE DEVICE INSERTION  11/19/2014   MIRENA    OB History    Gravida  3   Para  3   Term      Preterm      AB      Living  3     SAB      TAB      Ectopic      Multiple      Live Births               Home Medications    Prior to Admission medications   Medication Sig Start Date End Date Taking? Authorizing Provider  Cholecalciferol (VITAMIN D PO) Take by mouth.    [provider]  predniSONE (DELTASONE) 10 MG tablet Take 2 tablets (20 mg total) by mouth daily. 04/02/19   Wardell Honour, MD    Family History Family History  Problem Relation Age of Onset  . Hypertension Mother   . Rheum arthritis Maternal Grandmother     Social History Social History   Tobacco Use  . Smoking status: Former Research scientist (life sciences)  . Smokeless tobacco: Never Used  . Tobacco comment: Quit in 2005  Substance Use Topics  . Alcohol use: Yes    Alcohol/week: 0.0 standard drinks    Comment: social  . Drug use: No     Allergies   Paroxetine, Sulfonamide derivatives,  and Penicillins   Review of Systems Review of Systems  Musculoskeletal: Positive for back pain.  All other systems reviewed and are negative.    Physical Exam Triage Vital Signs ED Triage Vitals  Enc Vitals Group     BP 04/02/19 1138 120/73     Pulse Rate 04/02/19 1138 (!) 57     Resp 04/02/19 1138 12     Temp 04/02/19 1138 98.5 F (36.9 C)     Temp Source 04/02/19 1138 Oral     SpO2 04/02/19 1138 99 %     Weight --      Height --      Head Circumference --      Peak Flow --      Pain Score 04/02/19 1137 4     Pain Loc --      Pain Edu? --      Excl. in [place]? --    No data found.  Updated Vital Signs BP 120/73 (BP Location: Right Arm)   Pulse (!) 57   Temp 98.5 F (36.9 C) (Oral)  Resp 12   SpO2 99%   Visual Acuity Right Eye Distance:   Left Eye Distance:   Bilateral Distance:    Right Eye Near:   Left Eye Near:    Bilateral Near:     Physical Exam Vitals signs and nursing note reviewed.  Constitutional:      Appearance: Normal appearance. She is normal weight.  Musculoskeletal:     Comments: Back: Normal range of motion Tenderness over right SI joint Straight leg raising negative Deep tendon reflexes symmetric Normal toe and heel walking  Neurological:     Mental Status: She is alert.      UC Treatments / Results  Labs (all labs ordered are listed, but only abnormal results are displayed) Labs Reviewed - No data to display  EKG   Radiology No results found.  Procedures Procedures (including critical care time)  Medications Ordered in UC Medications - No data to display  Initial Impression / Assessment and Plan / UC Course  I have reviewed the triage vital signs and the nursing notes.  Pertinent labs & imaging results that were available during my care of the patient were reviewed by me and considered in my medical decision making (see chart for details).     Sacroiliitis.  Will treat with a short course of prednisone and  exercises Final Clinical Impressions(s) / UC Diagnoses   Final diagnoses:  Sacroiliitis [facility])   Discharge Instructions   None    ED Prescriptions    Medication Sig Dispense Auth. Provider   predniSONE (DELTASONE) 10 MG tablet Take 2 tablets (20 mg total) by mouth daily. 15 tablet Frederica Kuster, MD     PDMP not reviewed this encounter.   Frederica Kuster, MD 04/02/19 [phone]

## 2019-04-11 DIAGNOSIS — Z03818 Encounter for observation for suspected exposure to other biological agents ruled out: Secondary | ICD-10-CM | POA: Diagnosis not present

## 2019-04-12 ENCOUNTER — Other Ambulatory Visit: Payer: Self-pay

## 2019-04-12 ENCOUNTER — Ambulatory Visit (HOSPITAL_COMMUNITY)
Admission: EM | Admit: 2019-04-12 | Discharge: 2019-04-12 | Disposition: A | Discharge status: Home / Self Care | Payer: BC Managed Care – PPO | Attending: Emergency Medicine | Admitting: Emergency Medicine

## 2019-04-12 DIAGNOSIS — M545 Low back pain: Secondary | ICD-10-CM | POA: Diagnosis not present

## 2019-04-12 NOTE — ED Notes (Signed)
Patient called x3 with no response °

## 2019-04-12 NOTE — ED Notes (Signed)
Patient has not answered after being called x 3.  No one aware of patient's reason for leaving

## 2019-04-17 DIAGNOSIS — M545 Low back pain: Secondary | ICD-10-CM | POA: Diagnosis not present

## 2019-05-03 ENCOUNTER — Telehealth: Payer: Self-pay | Admitting: *Deleted

## 2019-05-03 DIAGNOSIS — N6002 Solitary cyst of left breast: Secondary | ICD-10-CM | POA: Diagnosis not present

## 2019-05-03 DIAGNOSIS — N6321 Unspecified lump in the left breast, upper outer quadrant: Secondary | ICD-10-CM | POA: Diagnosis not present

## 2019-05-03 DIAGNOSIS — N6331 Unspecified lump in axillary tail of the right breast: Secondary | ICD-10-CM | POA: Diagnosis not present

## 2019-05-03 NOTE — Telephone Encounter (Signed)
[facility] called patient was scheduled for mammogram screening however mentioned newly found breast lump. [facility] wanted a verbal okay to proceed with diagnostic mammogram and fax order later for signature. Verbal order was given.

## 2019-05-04 ENCOUNTER — Encounter: Payer: Self-pay | Admitting: Gynecology

## 2019-07-18 DIAGNOSIS — Z3040 Encounter for surveillance of contraceptives, unspecified: Secondary | ICD-10-CM | POA: Diagnosis not present

## 2019-10-19 DIAGNOSIS — Z3202 Encounter for pregnancy test, result negative: Secondary | ICD-10-CM | POA: Diagnosis not present

## 2019-10-19 DIAGNOSIS — Z30433 Encounter for removal and reinsertion of intrauterine contraceptive device: Secondary | ICD-10-CM | POA: Diagnosis not present

## 2019-12-07 DIAGNOSIS — Z30431 Encounter for routine checking of intrauterine contraceptive device: Secondary | ICD-10-CM | POA: Diagnosis not present

## 2020-02-01 DIAGNOSIS — M26629 Arthralgia of temporomandibular joint, unspecified side: Secondary | ICD-10-CM | POA: Diagnosis not present

## 2020-02-01 DIAGNOSIS — H9312 Tinnitus, left ear: Secondary | ICD-10-CM | POA: Diagnosis not present

## 2020-02-02 ENCOUNTER — Other Ambulatory Visit: Payer: Self-pay | Admitting: Otolaryngology

## 2020-02-02 DIAGNOSIS — M26629 Arthralgia of temporomandibular joint, unspecified side: Secondary | ICD-10-CM

## 2020-02-05 ENCOUNTER — Ambulatory Visit
Admission: RE | Admit: 2020-02-05 | Discharge: 2020-02-05 | Disposition: A | Discharge status: Home / Self Care | Payer: BC Managed Care – PPO | Source: Ambulatory Visit | Attending: Otolaryngology | Admitting: Otolaryngology

## 2020-02-05 DIAGNOSIS — H9312 Tinnitus, left ear: Secondary | ICD-10-CM | POA: Diagnosis not present

## 2020-02-05 DIAGNOSIS — M26629 Arthralgia of temporomandibular joint, unspecified side: Secondary | ICD-10-CM

## 2020-07-19 DIAGNOSIS — M79602 Pain in left arm: Secondary | ICD-10-CM | POA: Diagnosis not present

## 2020-12-26 DIAGNOSIS — Z01419 Encounter for gynecological examination (general) (routine) without abnormal findings: Secondary | ICD-10-CM | POA: Diagnosis not present

## 2020-12-26 DIAGNOSIS — Z6821 Body mass index (BMI) 21.0-21.9, adult: Secondary | ICD-10-CM | POA: Diagnosis not present

## 2021-06-11 DIAGNOSIS — Z0000 Encounter for general adult medical examination without abnormal findings: Secondary | ICD-10-CM | POA: Diagnosis not present

## 2021-06-11 DIAGNOSIS — R41840 Attention and concentration deficit: Secondary | ICD-10-CM | POA: Diagnosis not present

## 2021-06-11 DIAGNOSIS — Z13220 Encounter for screening for lipoid disorders: Secondary | ICD-10-CM | POA: Diagnosis not present

## 2021-06-11 DIAGNOSIS — Z79899 Other long term (current) drug therapy: Secondary | ICD-10-CM | POA: Diagnosis not present

## 2021-06-26 DIAGNOSIS — Z1231 Encounter for screening mammogram for malignant neoplasm of breast: Secondary | ICD-10-CM | POA: Diagnosis not present

## 2021-07-11 DIAGNOSIS — R921 Mammographic calcification found on diagnostic imaging of breast: Secondary | ICD-10-CM | POA: Diagnosis not present

## 2021-07-11 DIAGNOSIS — R922 Inconclusive mammogram: Secondary | ICD-10-CM | POA: Diagnosis not present

## 2021-07-11 DIAGNOSIS — R928 Other abnormal and inconclusive findings on diagnostic imaging of breast: Secondary | ICD-10-CM | POA: Diagnosis not present

## 2021-07-31 ENCOUNTER — Other Ambulatory Visit: Payer: Self-pay

## 2021-07-31 DIAGNOSIS — N6011 Diffuse cystic mastopathy of right breast: Secondary | ICD-10-CM | POA: Diagnosis not present

## 2021-07-31 DIAGNOSIS — R921 Mammographic calcification found on diagnostic imaging of breast: Secondary | ICD-10-CM | POA: Diagnosis not present

## 2021-11-08 DIAGNOSIS — M7061 Trochanteric bursitis, right hip: Secondary | ICD-10-CM | POA: Diagnosis not present

## 2021-11-11 DIAGNOSIS — M25551 Pain in right hip: Secondary | ICD-10-CM | POA: Diagnosis not present

## 2022-01-08 DIAGNOSIS — R350 Frequency of micturition: Secondary | ICD-10-CM | POA: Diagnosis not present

## 2022-02-12 DIAGNOSIS — Z130 Encounter for screening for diseases of the blood and blood-forming organs and certain disorders involving the immune mechanism: Secondary | ICD-10-CM | POA: Diagnosis not present

## 2022-02-12 DIAGNOSIS — Z01419 Encounter for gynecological examination (general) (routine) without abnormal findings: Secondary | ICD-10-CM | POA: Diagnosis not present

## 2022-02-12 DIAGNOSIS — Z13220 Encounter for screening for lipoid disorders: Secondary | ICD-10-CM | POA: Diagnosis not present

## 2022-02-12 DIAGNOSIS — Z6822 Body mass index (BMI) 22.0-22.9, adult: Secondary | ICD-10-CM | POA: Diagnosis not present

## 2022-02-12 DIAGNOSIS — Z1329 Encounter for screening for other suspected endocrine disorder: Secondary | ICD-10-CM | POA: Diagnosis not present

## 2022-04-09 DIAGNOSIS — B078 Other viral warts: Secondary | ICD-10-CM | POA: Diagnosis not present

## 2022-05-19 DIAGNOSIS — M25551 Pain in right hip: Secondary | ICD-10-CM | POA: Diagnosis not present

## 2022-06-18 DIAGNOSIS — F411 Generalized anxiety disorder: Secondary | ICD-10-CM | POA: Diagnosis not present

## 2022-06-18 DIAGNOSIS — Z0000 Encounter for general adult medical examination without abnormal findings: Secondary | ICD-10-CM | POA: Diagnosis not present

## 2022-06-18 DIAGNOSIS — Z13220 Encounter for screening for lipoid disorders: Secondary | ICD-10-CM | POA: Diagnosis not present

## 2022-06-18 DIAGNOSIS — Z79899 Other long term (current) drug therapy: Secondary | ICD-10-CM | POA: Diagnosis not present

## 2022-07-04 DIAGNOSIS — Z1231 Encounter for screening mammogram for malignant neoplasm of breast: Secondary | ICD-10-CM | POA: Diagnosis not present

## 2023-06-24 DIAGNOSIS — Z13220 Encounter for screening for lipoid disorders: Secondary | ICD-10-CM | POA: Diagnosis not present

## 2023-06-24 DIAGNOSIS — F411 Generalized anxiety disorder: Secondary | ICD-10-CM | POA: Diagnosis not present

## 2023-06-24 DIAGNOSIS — Z79899 Other long term (current) drug therapy: Secondary | ICD-10-CM | POA: Diagnosis not present

## 2023-06-24 DIAGNOSIS — Z0000 Encounter for general adult medical examination without abnormal findings: Secondary | ICD-10-CM | POA: Diagnosis not present

## 2023-06-24 DIAGNOSIS — G4700 Insomnia, unspecified: Secondary | ICD-10-CM | POA: Diagnosis not present

## 2023-07-01 DIAGNOSIS — H7292 Unspecified perforation of tympanic membrane, left ear: Secondary | ICD-10-CM | POA: Diagnosis not present

## 2023-07-29 DIAGNOSIS — Z1211 Encounter for screening for malignant neoplasm of colon: Secondary | ICD-10-CM | POA: Diagnosis not present

## 2023-08-12 DIAGNOSIS — Z124 Encounter for screening for malignant neoplasm of cervix: Secondary | ICD-10-CM | POA: Diagnosis not present

## 2023-08-12 DIAGNOSIS — Z01419 Encounter for gynecological examination (general) (routine) without abnormal findings: Secondary | ICD-10-CM | POA: Diagnosis not present

## 2023-08-19 DIAGNOSIS — Z1231 Encounter for screening mammogram for malignant neoplasm of breast: Secondary | ICD-10-CM | POA: Diagnosis not present

## 2024-01-26 DIAGNOSIS — M67912 Unspecified disorder of synovium and tendon, left shoulder: Secondary | ICD-10-CM | POA: Diagnosis not present

## 2024-01-26 DIAGNOSIS — M542 Cervicalgia: Secondary | ICD-10-CM | POA: Diagnosis not present

## 2024-03-13 ENCOUNTER — Emergency Department (HOSPITAL_BASED_OUTPATIENT_CLINIC_OR_DEPARTMENT_OTHER)
Admission: EM | Admit: 2024-03-13 | Discharge: 2024-03-13 | Discharge status: Against Medical Advice | Attending: Emergency Medicine | Admitting: Emergency Medicine

## 2024-03-13 ENCOUNTER — Other Ambulatory Visit: Payer: Self-pay

## 2024-03-13 DIAGNOSIS — Z5321 Procedure and treatment not carried out due to patient leaving prior to being seen by health care provider: Secondary | ICD-10-CM | POA: Insufficient documentation

## 2024-03-13 DIAGNOSIS — R251 Tremor, unspecified: Secondary | ICD-10-CM | POA: Diagnosis not present

## 2024-03-13 DIAGNOSIS — R6883 Chills (without fever): Secondary | ICD-10-CM | POA: Insufficient documentation

## 2024-03-13 NOTE — ED Triage Notes (Signed)
 Feels she maybe having a panic attack- symptoms include shaking-chills-upset stomach, was feeling SIB. Has xanax for anxiety. Usually takes half- took 1 whole pill 45 min PTA. In triage most symptoms have resolved.
